# Patient Record
Sex: Female | Born: 1965 | Race: White | Hispanic: No | Marital: Married | Smoking: Never smoker
Health system: Southern US, Community
[De-identification: ages and names within clinical notes are randomized; demographics above are authoritative.]

## PROBLEM LIST (undated history)

## (undated) DIAGNOSIS — T7840XA Allergy, unspecified, initial encounter: Secondary | ICD-10-CM

## (undated) DIAGNOSIS — R519 Headache, unspecified: Secondary | ICD-10-CM

## (undated) DIAGNOSIS — D649 Anemia, unspecified: Secondary | ICD-10-CM

## (undated) DIAGNOSIS — K2970 Gastritis, unspecified, without bleeding: Secondary | ICD-10-CM

## (undated) DIAGNOSIS — K589 Irritable bowel syndrome without diarrhea: Secondary | ICD-10-CM

## (undated) HISTORY — DX: Allergy, unspecified, initial encounter: T78.40XA

## (undated) HISTORY — DX: Irritable bowel syndrome, unspecified: K58.9

## (undated) HISTORY — DX: Anemia, unspecified: D64.9

## (undated) HISTORY — DX: Gastritis, unspecified, without bleeding: K29.70

## (undated) HISTORY — DX: Headache, unspecified: R51.9

---

## 1989-04-09 HISTORY — PX: APPENDECTOMY: SHX54

## 1990-04-09 HISTORY — PX: PILONIDAL CYST EXCISION: SHX744

## 1990-04-09 HISTORY — PX: LAPAROSCOPY: SHX197

## 1993-04-09 HISTORY — PX: LAPAROSCOPY: SHX197

## 2002-04-09 HISTORY — PX: OOPHORECTOMY: SHX86

## 2005-04-09 HISTORY — PX: OVARIAN CYST REMOVAL: SHX89

## 2015-11-12 DIAGNOSIS — N3001 Acute cystitis with hematuria: Secondary | ICD-10-CM | POA: Diagnosis not present

## 2015-11-12 DIAGNOSIS — N3090 Cystitis, unspecified without hematuria: Secondary | ICD-10-CM | POA: Diagnosis not present

## 2016-07-09 DIAGNOSIS — J039 Acute tonsillitis, unspecified: Secondary | ICD-10-CM | POA: Diagnosis not present

## 2016-11-13 ENCOUNTER — Ambulatory Visit (INDEPENDENT_AMBULATORY_CARE_PROVIDER_SITE_OTHER): Payer: BLUE CROSS/BLUE SHIELD | Admitting: Family Medicine

## 2016-11-13 ENCOUNTER — Encounter: Payer: Self-pay | Admitting: Family Medicine

## 2016-11-13 VITALS — BP 134/85 | HR 75 | Ht 62.0 in | Wt 138.3 lb

## 2016-11-13 DIAGNOSIS — I8390 Asymptomatic varicose veins of unspecified lower extremity: Secondary | ICD-10-CM | POA: Insufficient documentation

## 2016-11-13 DIAGNOSIS — E28319 Asymptomatic premature menopause: Secondary | ICD-10-CM | POA: Diagnosis not present

## 2016-11-13 DIAGNOSIS — Z809 Family history of malignant neoplasm, unspecified: Secondary | ICD-10-CM | POA: Diagnosis not present

## 2016-11-13 DIAGNOSIS — Z114 Encounter for screening for human immunodeficiency virus [HIV]: Secondary | ICD-10-CM | POA: Diagnosis not present

## 2016-11-13 DIAGNOSIS — Z139 Encounter for screening, unspecified: Secondary | ICD-10-CM

## 2016-11-13 DIAGNOSIS — Z862 Personal history of diseases of the blood and blood-forming organs and certain disorders involving the immune mechanism: Secondary | ICD-10-CM | POA: Insufficient documentation

## 2016-11-13 DIAGNOSIS — M542 Cervicalgia: Secondary | ICD-10-CM | POA: Insufficient documentation

## 2016-11-13 DIAGNOSIS — Z833 Family history of diabetes mellitus: Secondary | ICD-10-CM | POA: Insufficient documentation

## 2016-11-13 DIAGNOSIS — Z1159 Encounter for screening for other viral diseases: Secondary | ICD-10-CM

## 2016-11-13 DIAGNOSIS — Z8719 Personal history of other diseases of the digestive system: Secondary | ICD-10-CM | POA: Diagnosis not present

## 2016-11-13 DIAGNOSIS — N809 Endometriosis, unspecified: Secondary | ICD-10-CM | POA: Insufficient documentation

## 2016-11-13 DIAGNOSIS — J3089 Other allergic rhinitis: Secondary | ICD-10-CM | POA: Insufficient documentation

## 2016-11-13 DIAGNOSIS — G8929 Other chronic pain: Secondary | ICD-10-CM

## 2016-11-13 DIAGNOSIS — R5383 Other fatigue: Secondary | ICD-10-CM | POA: Diagnosis not present

## 2016-11-13 DIAGNOSIS — Z9889 Other specified postprocedural states: Secondary | ICD-10-CM | POA: Insufficient documentation

## 2016-11-13 DIAGNOSIS — M549 Dorsalgia, unspecified: Secondary | ICD-10-CM

## 2016-11-13 NOTE — Patient Instructions (Addendum)
Please make an appointment in the near future for fasting blood work this will be a lab only visit.  Please don't eat past midnight.  Then make a follow-up appointment with me approximately one week later to discuss the results.      Please realize, EXERCISE IS MEDICINE!  -  American Heart Association [facility]( AHA) guidelines for exercise : If you are in good health, without any medical conditions, you should engage in 150 minutes of moderate intensity aerobic activity per week.  This means you should be huffing and puffing throughout your workout.   Engaging in regular exercise will improve brain function and memory, as well as improve mood, boost immune system and help with weight management.  As well as the other, more well-known effects of exercise such as decreasing blood sugar levels, decreasing blood pressure,  and decreasing bad cholesterol levels/ increasing good cholesterol levels.     -  The AHA strongly endorses consumption of a diet that contains a variety of foods from all the food categories with an emphasis on fruits and vegetables; fat-free and low-fat dairy products; cereal and grain products; legumes and nuts; and fish, poultry, and/or extra lean meats.    Excessive food intake, especially of foods high in saturated and trans fats, sugar, and salt, should be avoided.    Adequate water intake of roughly 1/2 of your weight in pounds, should equal the ounces of water per day you should drink.  So for instance, if you're 200 pounds, that would be 100 ounces of water per day.         Mediterranean Diet  Why follow it? Research shows. . Those who follow the Mediterranean diet have a reduced risk of heart disease  . The diet is associated with a reduced incidence of Parkinson's and Alzheimer's diseases . People following the diet may have longer life expectancies and lower rates of chronic diseases  . The Dietary Guidelines for Americans recommends the Mediterranean diet as an eating plan  to promote health and prevent disease  What Is the Mediterranean Diet?  . Healthy eating plan based on typical foods and recipes of Mediterranean-style cooking . The diet is primarily a plant based diet; these foods should make up a majority of meals   Starches - Plant based foods should make up a majority of meals - They are an important sources of vitamins, minerals, energy, antioxidants, and fiber - Choose whole grains, foods high in fiber and minimally processed items  - Typical grain sources include wheat, oats, barley, corn, brown rice, bulgar, farro, millet, polenta, couscous  - Various types of beans include chickpeas, lentils, fava beans, black beans, white beans   Fruits  Veggies - Large quantities of antioxidant rich fruits & veggies; 6 or more servings  - Vegetables can be eaten raw or lightly drizzled with oil and cooked  - Vegetables common to the traditional Mediterranean Diet include: artichokes, arugula, beets, broccoli, brussel sprouts, cabbage, carrots, celery, collard greens, cucumbers, eggplant, kale, leeks, lemons, lettuce, mushrooms, okra, onions, peas, peppers, potatoes, pumpkin, radishes, rutabaga, shallots, spinach, sweet potatoes, turnips, zucchini - Fruits common to the Mediterranean Diet include: apples, apricots, avocados, cherries, clementines, dates, figs, grapefruits, grapes, melons, nectarines, oranges, peaches, pears, pomegranates, strawberries, tangerines  Fats - Replace butter and margarine with healthy oils, such as olive oil, canola oil, and tahini  - Limit nuts to no more than a handful a day  - Nuts include walnuts, almonds, pecans, pistachios, pine nuts  -  Limit or avoid candied, honey roasted or heavily salted nuts - Olives are central to the Mediterranean diet - can be eaten whole or used in a variety of dishes   Meats Protein - Limiting red meat: no more than a few times a month - When eating red meat: choose lean cuts and keep the portion to the size  of deck of cards - Eggs: approx. 0 to 4 times a week  - Fish and lean poultry: at least 2 a week  - Healthy protein sources include, chicken, [place], lean beef, lamb - Increase intake of seafood such as tuna, salmon, trout, mackerel, shrimp, scallops - Avoid or limit high fat processed meats such as sausage and bacon  Dairy - Include moderate amounts of low fat dairy products  - Focus on healthy dairy such as fat free yogurt, skim milk, low or reduced fat cheese - Limit dairy products higher in fat such as whole or 2% milk, cheese, ice cream  Alcohol - Moderate amounts of red wine is ok  - No more than 5 oz daily for women (all ages) and men older than age 75  - No more than 10 oz of wine daily for men younger than 59  Other - Limit sweets and other desserts  - Use herbs and spices instead of salt to flavor foods  - Herbs and spices common to the traditional Mediterranean Diet include: basil, bay leaves, chives, cloves, cumin, fennel, garlic, lavender, marjoram, mint, oregano, parsley, pepper, rosemary, sage, savory, sumac, tarragon, thyme   It's not just a diet, it's a lifestyle:  . The Mediterranean diet includes lifestyle factors typical of those in the region  . Foods, drinks and meals are best eaten with others and savored . Daily physical activity is important for overall good health . This could be strenuous exercise like running and aerobics . This could also be more leisurely activities such as walking, housework, yard-work, or taking the stairs . Moderation is the key; a balanced and healthy diet accommodates most foods and drinks . Consider portion sizes and frequency of consumption of certain foods   Meal Ideas & Options:  . Breakfast:  o Whole wheat toast or whole wheat English muffins with peanut butter & hard boiled egg o Steel cut oats topped with apples & cinnamon and skim milk  o Fresh fruit: banana, strawberries, melon, berries, peaches  o Smoothies: strawberries,  bananas, greek yogurt, peanut butter o Low fat greek yogurt with blueberries and granola  o Egg white omelet with spinach and mushrooms o Breakfast couscous: whole wheat couscous, apricots, skim milk, cranberries  . Sandwiches:  o Hummus and grilled vegetables (peppers, zucchini, squash) on whole wheat bread   o Grilled chicken on whole wheat pita with lettuce, tomatoes, cucumbers or tzatziki  o Tuna salad on whole wheat bread: tuna salad made with greek yogurt, olives, red peppers, capers, green onions o Garlic rosemary lamb pita: lamb sauted with garlic, rosemary, salt & pepper; add lettuce, cucumber, greek yogurt to pita - flavor with lemon juice and black pepper  . Seafood:  o Mediterranean grilled salmon, seasoned with garlic, basil, parsley, lemon juice and black pepper o Shrimp, lemon, and spinach whole-grain pasta salad made with low fat greek yogurt  o Seared scallops with lemon orzo  o Seared tuna steaks seasoned salt, pepper, coriander topped with tomato mixture of olives, tomatoes, olive oil, minced garlic, parsley, green onions and cappers  . Meats:  o Herbed greek chicken salad  with kalamata olives, cucumber, feta  o Red bell peppers stuffed with spinach, bulgur, lean ground beef (or lentils) & topped with feta   o Kebabs: skewers of chicken, tomatoes, onions, zucchini, squash  o [place] burgers: made with red onions, mint, dill, lemon juice, feta cheese topped with roasted red peppers . Vegetarian o Cucumber salad: cucumbers, artichoke hearts, celery, red onion, feta cheese, tossed in olive oil & lemon juice  o Hummus and whole grain pita points with a greek salad (lettuce, tomato, feta, olives, cucumbers, red onion) o Lentil soup with celery, carrots made with vegetable broth, garlic, salt and pepper  o Tabouli salad: parsley, bulgur, mint, scallions, cucumbers, tomato, radishes, lemon juice, olive oil, salt and pepper.

## 2016-11-13 NOTE — Assessment & Plan Note (Signed)
Symptoms well-controlled currently.  Patient knows what activities to avoid in order to keep symptoms in check.

## 2016-11-13 NOTE — Assessment & Plan Note (Signed)
Discussed preventative strategies of Neomed sinus rinses twice a day and after any prolonged exposure to offending allergens. - Discussed over-the-counter Flonase and other medicines such as Allegra\Claritin etc. - Symptoms not bad currently.  She will let us know if she needs further direction with treatment in the future

## 2016-11-13 NOTE — Assessment & Plan Note (Signed)
Asymptomatic at current.

## 2016-11-13 NOTE — Assessment & Plan Note (Signed)
No IBS symptoms currently.    Patient avoids certain foods and knows what she can and cannot eat.  She declines need for GI referral or follow-up at this time.

## 2016-11-13 NOTE — Assessment & Plan Note (Signed)
Had seen GYN in the past.  None for approximately 6 years.   - Patient declines need for hormone replacement therapy

## 2016-11-13 NOTE — Progress Notes (Signed)
New patient office visit note:  Impression and Recommendations:    1. Environmental and seasonal allergies   2. Endometriosis   3. Early menopause occurring in patient age younger than 45 years   4. Family history of cancer- "bone CA" mother died 86   5. Family history of diabetes mellitus in mother- onset in 63s   6. Other fatigue   7. History of iron deficiency anemia   8. History of IBS   9. Encounter for health-related screening   10. Screening for HIV (human immunodeficiency virus)   11. Need for hepatitis C screening test      Chronic neck and back pain Symptoms well-controlled currently.  Patient knows what activities to avoid in order to keep symptoms in check.  Early menopause occurring in patient age younger than 45 years Had seen GYN in the past.  None for approximately 6 years.   - Patient declines need for hormone replacement therapy  Endometriosis Asymptomatic at current.  Environmental and seasonal allergies Discussed preventative strategies of Neomed sinus rinses twice a day and after any prolonged exposure to offending allergens. - Discussed over-the-counter Flonase and other medicines such as Allegra\Claritin etc. - Symptoms not bad currently.  She will let us know if she needs further direction with treatment in the future  S/P colonoscopy No IBS symptoms currently.    Patient avoids certain foods and knows what she can and cannot eat.  She declines need for GI referral or follow-up at this time.   The patient was counseled, risk factors were discussed, anticipatory guidance given.   New Prescriptions   No medications on file    Meds ordered this encounter  Medications  . cetirizine (ZYRTEC) 10 MG tablet    Sig: Take 10 mg by mouth daily.  . APPLE CIDER VINEGAR PO    Sig: Take 1 tablet by mouth daily.  . Probiotic Product (PROBIOTIC DAILY PO)    Sig: Take 1 tablet by mouth daily.    Discontinued Medications   No medications on  file    Modified Medications   No medications on file    Orders Placed This Encounter  Procedures  . CBC with Differential/Platelet  . Comprehensive metabolic panel  . Hemoglobin A1c  . Lipid panel  . HIV antibody  . Hepatitis C antibody  . TSH  . T4, free  . VITAMIN D 25 Hydroxy (Vit-D Deficiency, Fractures)     Gross side effects, risk and benefits, and alternatives of medications discussed with patient.  Patient is aware that all medications have potential side effects and we are unable to predict every side effect or drug-drug interaction that may occur.  Expresses verbal understanding and consents to current therapy plan and treatment regimen.  Return for Fasting blood work near future, then OV with me 1 wk later.  Please see AVS handed out to patient at the end of our visit for further patient instructions/ counseling done pertaining to today's office visit.    Note: This document was prepared using Dragon voice recognition software and may include unintentional dictation errors.  ----------------------------------------------------------------------------------------------------------------------    Subjective:    Chief complaint:   Chief Complaint  Patient presents with  . Establish Care     HPI: Heidi Hart is a pleasant 51 y.o. female who presents to [facility] today to review their medical history with me and establish care.   I asked the patient to review their chronic  problem list with me to ensure everything was updated and accurate.    All recent office visits with other providers, any medical records that patient brought in etc  - I reviewed today.     Also asked pt to get me [facility] from [facility] providers/ specialists that they had seen within the past 3-5 years- if they are in private practice and/or do not work for a [facility], [facility], [facility], [facility] or [facility] owned practice.  Told them to call their specialists  to clarify this if they are not sure.   [facility] - Myra Hamrick, prior with [facility], [place]  Went through past history of having heart palpitations, went to heart doc etc.  Then ended up at ENDO Doc as well- found to have mild anemia--> took iron supp and felt better.    Patient exercises at times, approximately 2-3 times weekly for 30-45 minutes and does walking videos.  She is not consistent with this   - Sedentary job.  Is a third grade teacher now.  - Patient's married to Heidi Hart they have 1  12yo adopted child Heidi Hart, from [place] who they adopted as a baby,  and recently adopted an 311 and 360 year old -Heidi Hart and Heidi Hart- whom they acquired through foster care.   This has brought her a lot of joy but also a lot of stress as well.  I have a very sensitive body."  Patient states she is very sensitive to medications.  She tends to have a lot of side effects to medicines and sensitivities.      She has a history of getting allergy shots from age [age] but felt that they started to wear off in effectiveness so patient stopped and does over-the-counter medicines for her allergies now.  She needs to take them all year long.     Problem  Environmental and Seasonal Allergies   Used to get allergy shots about age [id number]- 37 yo.  Takes zyrtec QD     Endometriosis   No GYN in town- last PAP around 6 yrs ago or so.  Always N.     Early Menopause Occurring in Patient Age Younger Than 45 Years  S/P Colonoscopy   04/16/05--> done for IBS sx.  Found nothing.    Chronic Neck and Back Pain   Pain can even go down her leg on R.     Varicose vein of leg -R  Family history of cancer- "bone CA" mother died [id number]    "bone CA" mother died [id number]; father non-Hodgkin's lymphoma    Family history of diabetes mellitus in mother- onset in 5140s   Patient's mother with similar body habitus as herself.  Onset of type 2 diabetes in her mid 5040s.   Fatigue   Patient feeling more tired than usual for the  past year or so;  changed her job to a very sedentary 1 at that time; she's put on approximately 10-15 pounds;  job is more stressful now not sure if that is playing part of it or if she has a endocrine problem etc.   History of Iron Deficiency Anemia   Patient had seen endocrinology in the remote past when she was having some heart palpitations and nonspecific cardiac symptoms.  Cardiology workup was negative and she was sent to endocrine.    Found to be very mildly iron deficient anemia and supplementation made her feel much better.  Symptoms were resolved then.   History of Ibs  Wt Readings from Last 3 Encounters:  11/29/2016 138 lb 4.8 oz (62.7 kg)   BP Readings from Last 3 Encounters:  Nov 29, 2016 134/85   Pulse Readings from Last 3 Encounters:  2016-11-29 75   BMI Readings from Last 3 Encounters:  11/29/2016 25.30 kg/m    Patient Care Team    Relationship Specialty Notifications Start End  Thomasene Lot, DO PCP - General Family Medicine  10/31/16     Patient Active Problem List   Diagnosis Date Noted  . Environmental and seasonal allergies 2016/11/29  . Endometriosis 11-29-2016  . Early menopause occurring in patient age younger than 39 years 11/29/16  . S/P colonoscopy 2016/11/29  . Chronic neck and back pain 11-29-16  . Varicose vein of leg -R 11/29/16  . Family history of cancer- "bone CA" mother died 69 November 29, 2016  . Family history of diabetes mellitus in mother- onset in 9s 2016-11-29  . Fatigue 11/29/16  . History of iron deficiency anemia November 29, 2016  . History of IBS 2016/11/29     Past Medical History:  Diagnosis Date  . Allergy      Past Medical History:  Diagnosis Date  . Allergy      Past Surgical History:  Procedure Laterality Date  . APPENDECTOMY  1991  . LAPAROSCOPY  1992  . LAPAROSCOPY  1995  . OOPHORECTOMY  2004   left  . OVARIAN CYST REMOVAL  2007  . PILONIDAL CYST EXCISION  1992     Family History  Problem Relation  Age of Onset  . Cancer Mother        bone  . Hypertension Mother   . Cancer Father        nonhodgkins lymphoma     History  Drug Use No     History  Alcohol Use No     History  Smoking Status  . Never Smoker  Smokeless Tobacco  . Never Used     Outpatient Encounter Prescriptions as of 11-29-16  Medication Sig  . APPLE CIDER VINEGAR PO Take 1 tablet by mouth daily.  . cetirizine (ZYRTEC) 10 MG tablet Take 10 mg by mouth daily.  . Probiotic Product (PROBIOTIC DAILY PO) Take 1 tablet by mouth daily.   No facility-administered encounter medications on file as of November 29, 2016.     Allergies: Cogentin [benztropine]; Reglan [metoclopramide]; and Sulfa antibiotics   Review of Systems  Constitutional: Negative for chills, diaphoresis, fever, malaise/fatigue and weight loss.  HENT: Negative for congestion, sore throat and tinnitus.   Eyes: Negative for blurred vision, double vision and photophobia.  Respiratory: Negative for cough and wheezing.   Cardiovascular: Negative for chest pain and palpitations.  Gastrointestinal: Negative for blood in stool, diarrhea, nausea and vomiting.  Genitourinary: Negative for dysuria, frequency and urgency.  Musculoskeletal: Negative for joint pain and myalgias.  Skin: Negative for itching and rash.  Neurological: Negative for dizziness, focal weakness, weakness and headaches.  Endo/Heme/Allergies: Negative for environmental allergies and polydipsia. Does not bruise/bleed easily.  Psychiatric/Behavioral: Negative for depression and memory loss. The patient is not nervous/anxious and does not have insomnia.      Objective:   Blood pressure 134/85, pulse 75, height 5\' 2"  (1.575 m), weight 138 lb 4.8 oz (62.7 kg), last menstrual period 10/24/2016. Body mass index is 25.3 kg/m. General: Well Developed, well nourished, and in no acute distress.  Neuro: Alert and oriented x3, extra-ocular muscles intact, sensation grossly intact.    HEENT:[place]/AT, PERRLA, neck supple, No carotid bruits Skin: no gross  rashes  Cardiac: Regular rate and rhythm Respiratory: Essentially clear to auscultation bilaterally. Not using accessory muscles, speaking in full sentences.  Abdominal: not grossly distended Musculoskeletal: Ambulates w/o diff, FROM * 4 ext.  Vasc: less 2 sec cap RF, warm and pink  Psych:  No HI/SI, judgement and insight good, Euthymic mood. Full Affect.    No results found for this or any previous visit (from the past 2160 hour(s)).

## 2016-11-14 ENCOUNTER — Other Ambulatory Visit: Payer: BLUE CROSS/BLUE SHIELD

## 2016-11-14 DIAGNOSIS — R5383 Other fatigue: Secondary | ICD-10-CM | POA: Diagnosis not present

## 2016-11-14 DIAGNOSIS — Z833 Family history of diabetes mellitus: Secondary | ICD-10-CM

## 2016-11-14 DIAGNOSIS — Z114 Encounter for screening for human immunodeficiency virus [HIV]: Secondary | ICD-10-CM

## 2016-11-14 DIAGNOSIS — Z139 Encounter for screening, unspecified: Secondary | ICD-10-CM

## 2016-11-14 DIAGNOSIS — Z862 Personal history of diseases of the blood and blood-forming organs and certain disorders involving the immune mechanism: Secondary | ICD-10-CM | POA: Diagnosis not present

## 2016-11-14 DIAGNOSIS — Z1159 Encounter for screening for other viral diseases: Secondary | ICD-10-CM

## 2016-11-14 DIAGNOSIS — Z8719 Personal history of other diseases of the digestive system: Secondary | ICD-10-CM

## 2016-11-15 LAB — CBC WITH DIFFERENTIAL/PLATELET
BASOS: 0 %
Basophils Absolute: 0 10*3/uL (ref 0.0–0.2)
EOS (ABSOLUTE): 0.2 10*3/uL (ref 0.0–0.4)
EOS: 3 %
HEMATOCRIT: 34.9 % (ref 34.0–46.6)
Hemoglobin: 11.8 g/dL (ref 11.1–15.9)
IMMATURE GRANS (ABS): 0 10*3/uL (ref 0.0–0.1)
IMMATURE GRANULOCYTES: 0 %
LYMPHS: 28 %
Lymphocytes Absolute: 1.4 10*3/uL (ref 0.7–3.1)
MCH: 29.4 pg (ref 26.6–33.0)
MCHC: 33.8 g/dL (ref 31.5–35.7)
MCV: 87 fL (ref 79–97)
MONOCYTES: 7 %
Monocytes Absolute: 0.4 10*3/uL (ref 0.1–0.9)
NEUTROS ABS: 3 10*3/uL (ref 1.4–7.0)
Neutrophils: 62 %
Platelets: 202 10*3/uL (ref 150–379)
RBC: 4.01 x10E6/uL (ref 3.77–5.28)
RDW: 12.8 % (ref 12.3–15.4)
WBC: 4.9 10*3/uL (ref 3.4–10.8)

## 2016-11-15 LAB — LIPID PANEL
CHOL/HDL RATIO: 3.1 ratio (ref 0.0–4.4)
Cholesterol, Total: 186 mg/dL (ref 100–199)
HDL: 60 mg/dL (ref >39)
LDL CALC: 118 mg/dL — AB (ref 0–99)
Triglycerides: 41 mg/dL (ref 0–149)
VLDL CHOLESTEROL CAL: 8 mg/dL (ref 5–40)

## 2016-11-15 LAB — HIV ANTIBODY (ROUTINE TESTING W REFLEX): HIV SCREEN 4TH GENERATION: NONREACTIVE

## 2016-11-15 LAB — COMPREHENSIVE METABOLIC PANEL
A/G RATIO: 1.8 (ref 1.2–2.2)
ALT: 19 IU/L (ref 0–32)
AST: 20 IU/L (ref 0–40)
Albumin: 4.3 g/dL (ref 3.5–5.5)
Alkaline Phosphatase: 70 IU/L (ref 39–117)
BUN/Creatinine Ratio: 30 — ABNORMAL HIGH (ref 9–23)
BUN: 21 mg/dL (ref 6–24)
Bilirubin Total: 0.4 mg/dL (ref 0.0–1.2)
CALCIUM: 9.4 mg/dL (ref 8.7–10.2)
CO2: 25 mmol/L (ref 20–29)
Chloride: 104 mmol/L (ref 96–106)
Creatinine, Ser: 0.71 mg/dL (ref 0.57–1.00)
GFR, EST AFRICAN AMERICAN: 116 mL/min/{1.73_m2} (ref >59)
GFR, EST NON AFRICAN AMERICAN: 101 mL/min/{1.73_m2} (ref >59)
GLOBULIN, TOTAL: 2.4 g/dL (ref 1.5–4.5)
Glucose: 87 mg/dL (ref 65–99)
POTASSIUM: 4.3 mmol/L (ref 3.5–5.2)
SODIUM: 142 mmol/L (ref 134–144)
TOTAL PROTEIN: 6.7 g/dL (ref 6.0–8.5)

## 2016-11-15 LAB — T4, FREE: Free T4: 1.14 ng/dL (ref 0.82–1.77)

## 2016-11-15 LAB — TSH: TSH: 2.01 u[IU]/mL (ref 0.450–4.500)

## 2016-11-15 LAB — HEMOGLOBIN A1C
Est. average glucose Bld gHb Est-mCnc: 108 mg/dL
Hgb A1c MFr Bld: 5.4 % (ref 4.8–5.6)

## 2016-11-15 LAB — VITAMIN D 25 HYDROXY (VIT D DEFICIENCY, FRACTURES): Vit D, 25-Hydroxy: 39.9 ng/mL (ref 30.0–100.0)

## 2016-11-15 LAB — HEPATITIS C ANTIBODY

## 2016-11-20 ENCOUNTER — Ambulatory Visit (INDEPENDENT_AMBULATORY_CARE_PROVIDER_SITE_OTHER): Payer: BLUE CROSS/BLUE SHIELD | Admitting: Family Medicine

## 2016-11-20 ENCOUNTER — Encounter: Payer: Self-pay | Admitting: Family Medicine

## 2016-11-20 NOTE — Progress Notes (Signed)
Impression and Recommendations:    No diagnosis found.   No problem-specific Assessment & Plan notes found for this encounter.   The patient was counseled, risk factors were discussed, anticipatory guidance given.   New Prescriptions   No medications on file     Discontinued Medications   No medications on file     Modified Medications   No medications on file      No orders of the defined types were placed in this encounter.    Gross side effects, risk and benefits, and alternatives of medications and treatment plan in general discussed with patient.  Patient is aware that all medications have potential side effects and we are unable to predict every side effect or drug-drug interaction that may occur.   Patient will call with any questions prior to using medication if they have concerns.  Expresses verbal understanding and consents to current therapy and treatment regimen.  No barriers to understanding were identified.  Red flag symptoms and signs discussed in detail.  Patient expressed understanding regarding what to do in case of emergency\urgent symptoms  Please see AVS handed out to patient at the end of our visit for further patient instructions/ counseling done pertaining to today's office visit.   No Follow-up on file.     Note: This document was prepared using Dragon voice recognition software and may include unintentional dictation errors.  Heidi Hart 11:50 AM --------------------------------------------------------------------------------------------------------------------------------------------------------------------------------------------------------------------------------------------    Subjective:    CC:  Chief Complaint  Patient presents with  . Follow-up    discuss labs    HPI: Heidi Hart is a 51 y.o. female [facility] for issues as discussed below.   No problems  updated.   Wt Readings from Last 3 Encounters:  11/20/16 139 lb 9.6 oz (63.3 kg)  11/13/16 138 lb 4.8 oz (62.7 kg)   BP Readings from Last 3 Encounters:  11/20/16 125/84  11/13/16 134/85   Pulse Readings from Last 3 Encounters:  11/20/16 68  11/13/16 75   BMI Readings from Last 3 Encounters:  11/20/16 25.53 kg/m  11/13/16 25.30 kg/m     Patient Care Team    Relationship Specialty Notifications Start End  Heidi Hart, Heidi Upperman, DO PCP - General Family Medicine  10/31/16      Patient Active Problem List   Diagnosis Date Noted  . Environmental and seasonal allergies 11/13/2016  . Endometriosis 11/13/2016  . Early menopause occurring in patient age younger than 5245 years 11/13/2016  . S/P colonoscopy 11/13/2016  . Chronic neck and back pain 11/13/2016  . Varicose vein of leg -R 11/13/2016  . Family history of cancer- "bone CA" mother died 5256 11/13/2016  . Family history of diabetes mellitus in mother- onset in 8040s 11/13/2016  . Fatigue 11/13/2016  . History of iron deficiency anemia 11/13/2016  . History of IBS 11/13/2016    Past Medical history, Surgical history, Family history, Social history, Allergies and Medications have been entered into the medical record, reviewed and changed as needed.    Current Meds  Medication Sig  . APPLE CIDER VINEGAR PO Take 1 tablet by mouth daily.  . cetirizine (ZYRTEC) 10 MG tablet Take 10 mg by mouth daily.  . Probiotic Product (PROBIOTIC DAILY PO) Take 1 tablet by mouth daily.    Allergies:  Allergies  Allergen Reactions  . Cogentin [Benztropine]     hallucations  . Reglan [Metoclopramide]  Muscle spasms  . Sulfa Antibiotics     tingling     Review of Systems: General:   Denies fever, chills, unexplained weight loss.  Optho/Auditory:   Denies visual changes, blurred vision/LOV Respiratory:   Denies wheeze, DOE more than baseline levels.  Cardiovascular:   Denies chest pain, palpitations, new onset peripheral edema    Gastrointestinal:   Denies nausea, vomiting, diarrhea, abd pain.  Genitourinary: Denies dysuria, freq/ urgency, flank pain or discharge from genitals.  Endocrine:     Denies hot or cold intolerance, polyuria, polydipsia. Musculoskeletal:   Denies unexplained myalgias, joint swelling, unexplained arthralgias, gait problems.  Skin:  Denies new onset rash, suspicious lesions Neurological:     Denies dizziness, unexplained weakness, numbness  Psychiatric/Behavioral:   Denies mood changes, suicidal or homicidal ideations, hallucinations    Objective:   Blood pressure 125/84, pulse 68, height 5\' 2"  (1.575 m), weight 139 lb 9.6 oz (63.3 kg), last menstrual period 10/24/2016. Body mass index is 25.53 kg/m. General:  Well Developed, well nourished, appropriate for stated age.  Neuro:  Alert and oriented,  extra-ocular muscles intact  HEENT:  Normocephalic, atraumatic, neck supple, no carotid bruits appreciated  Skin:  no gross rash, warm, pink. Cardiac:  RRR, S1 S2 Respiratory:  ECTA B/L and A/P, Not using accessory muscles, speaking in full sentences- unlabored. Vascular:  Ext warm, no cyanosis apprec.; cap RF less 2 sec. Psych:  No HI/SI, judgement and insight good, Euthymic mood. Full Affect.

## 2016-11-20 NOTE — Patient Instructions (Addendum)
"Lloyd Huger med sinus rinse" or AYR or "sinus rinses" - comes in plastic bottle.      Preventive Care for Adults  A healthy lifestyle and preventive care can promote health and wellness. Preventive health guidelines for men include the following key practices:  .   A routine yearly physical is a good way to check with your health care provider about your health and preventative screening. It is a chance to share any concerns and updates on your health and to receive a thorough exam.  .  Visit your dentist for a routine exam and preventative care every 6 months. Brush your teeth twice a day and floss once a day. Good oral hygiene prevents tooth decay and gum disease.  .  The frequency of eye exams is based on your age, health, family medical history, use of contact lenses, and other factors.  Follow your health care provider's recommendations for frequency of eye exams.  .  Eat a healthy diet.  Foods such as vegetables, fruits, whole grains, low-fat dairy products, and lean protein foods contain the nutrients you need without too many calories.  Decrease your intake of foods high in solid fats, added sugars, and salt.  Eat the right amount of calories for you.  Get information about a proper diet from your health care provider, if necessary.  .  Regular physical exercise is one of the most important things you can do for your health.  Most adults should get at least 150 minutes of moderate-intensity exercise (any activity that increases your heart rate and causes you to sweat) each week.  In addition, most adults need muscle-strengthening exercises on 2 or more days a week.  .  Maintain a healthy weight. The body mass index (BMI) is a screening tool to identify possible weight problems. It provides an estimate of body fat based on height and weight. Your health care provider can find your BMI and can help you achieve or maintain a healthy weight. For adults 20 years and older: A BMI below 18.5 is  considered underweight. A BMI of 18.5 to 24.9 is normal. A BMI of 25 to 29.9 is considered overweight. A BMI of 30 and above is considered obese.  .  Maintain normal blood lipids and cholesterol levels by exercising and minimizing your intake of saturated fat. Eat a balanced diet with plenty of fruit and vegetables. Blood tests for lipids and cholesterol should begin at age 65 and be repeated every 5 years. If your lipid or cholesterol levels are high, you are over 50, or you are at high risk for heart disease, you may need your cholesterol levels checked more frequently. Ongoing high lipid and cholesterol levels should be treated with medicines if diet and exercise are not working.  .  If you smoke, find out from your health care provider how to quit. If you do not use tobacco, do not start.  . If you choose to drink alcohol, do not have more than 2 drinks per day. One drink is considered to be 12 ounces (355 mL) of beer, 5 ounces (148 mL) of wine, or 1.5 ounces (44 mL) of liquor.  Marland Kitchen Avoid use of street drugs. Do not share needles with anyone. Ask for help if you need support or instructions about stopping the use of drugs.  . High blood pressure causes heart disease and increases the risk of stroke. Your blood pressure should be checked at least every 1-2 years. Ongoing high blood pressure should be  treated with medicines, if weight loss and exercise are not effective.  . If you are 7-39 years old, ask your health care provider if you should take aspirin to prevent heart disease.  . Diabetes screening involves taking a blood sample to check your fasting blood sugar level.  This should be done once every 3 years, after age 67, if you are within normal weight and without risk factors for diabetes.  Testing should be considered at a younger age or be carried out more frequently if you are overweight and have at least 1 risk factor for diabetes.  . Colorectal cancer can be detected and  often prevented. Most routine colorectal cancer screening begins at the age of 75 and continues through age 49. However, your health care provider may recommend screening at an earlier age if you have risk factors for colon cancer. On a yearly basis, your health care provider may provide home test kits to check for hidden blood in the stool. Use of a small camera at the end of a tube to directly examine the colon (sigmoidoscopy or colonoscopy) can detect the earliest forms of colorectal cancer. Talk to your health care provider about this at age 6, when routine screening begins. Direct exam of the colon should be repeated every 5-10 years through age 40, unless early forms of precancerous polyps or small growths are found.  .  Lung cancer screening is recommended for adults aged 55-80 years who are at high risk for developing lung cancer because of a history of smoking. A yearly low-dose CT scan of the lungs is recommended for people who have at least a 30-pack-year history of smoking and are a current smoker or have quit within the past 15 years. A pack year of smoking is smoking an average of 1 pack of cigarettes a day for 1 year (for example: 1 pack a day for 30 years or 2 packs a day for 15 years). Yearly screening should continue until the smoker has stopped smoking for at least 15 years. Yearly screening should be stopped for people who develop a health problem that would prevent them from having lung cancer treatment.  . Talk with your health care provider about prostate cancer screening.  . Testicular cancer screening is recommended for adult males. Screening includes self-exam and a health care provider exam. Consult with your health care provider about any symptoms you have or any concerns you have about testicular cancer.  . Use sunscreen. Apply sunscreen liberally and repeatedly throughout the day. You should seek shade when your shadow is shorter than you. Protect yourself by wearing long  sleeves, pants, a wide-brimmed hat, and sunglasses year round, whenever you are outdoors.  . Once a month, do a whole-body skin exam, using a mirror to look at the skin on your back. Tell your health care provider about new moles, moles that have irregular borders, moles that are larger than a pencil eraser, or moles that have changed in shape or color.    ++++++++++++++++++++++++++++++++++++++++++++++++++++++++++++++++++  Stay current with required vaccines (immunizations).  ? Influenza vaccine. All adults should be immunized every year.  ? Tetanus, diphtheria, and acellular pertussis (Td, Tdap) vaccine. An adult who has not previously received Tdap or who does not know his vaccine status should receive 1 dose of Tdap. This initial dose should be followed by tetanus and diphtheria toxoids (Td) booster doses every 10 years. Adults with an unknown or incomplete history of completing a 3-dose immunization series with Td-containing vaccines  should begin or complete a primary immunization series including a Tdap dose. Adults should receive a Td booster every 10 years.  ? Varicella vaccine. An adult without evidence of immunity to varicella should receive 2 doses or a second dose if he has previously received 1 dose.  ? Human papillomavirus (HPV) vaccine. Males aged 13-21 years who have not received the vaccine previously should receive the 3-dose series. Males aged 22-26 years may be immunized. Immunization is recommended through the age of 110 years for any female who has sex with males and did not get any or all doses earlier. Immunization is recommended for any person with an immunocompromised condition through the age of 26 years if he did not get any or all doses earlier. During the 3-dose series, the second dose should be obtained 4-8 weeks after the first dose. The third dose should be obtained 24 weeks after the first dose and 16 weeks after the second dose.  ? Zoster vaccine. One dose is  recommended for adults aged 1 years or older unless certain conditions are present.   ? PREVNAR - Pneumococcal 13-valent conjugate (PCV13) vaccine. When indicated, a person who is uncertain of his immunization history and has no record of immunization should receive the PCV13 vaccine. An adult aged 5 years or older who has certain medical conditions and has not been previously immunized should receive 1 dose of PCV13 vaccine. This PCV13 should be followed with a dose of pneumococcal polysaccharide (PPSV23) vaccine. The PPSV23 vaccine dose should be obtained at least 8 weeks after the dose of PCV13 vaccine. An adult aged 110 years or older who has certain medical conditions and previously received 1 or more doses of PPSV23 vaccine should receive 1 dose of PCV13. The PCV13 vaccine dose should be obtained 1 or more years after the last PPSV23 vaccine dose.   ? PNEUMOVAX - Pneumococcal polysaccharide (PPSV23) vaccine. When PCV13 is also indicated, PCV13 should be obtained first. All adults aged 15 years and older should be immunized. An adult younger than age 74 years who has certain medical conditions should be immunized. Any person who resides in a nursing home or long-term care facility should be immunized. An adult smoker should be immunized. People with an immunocompromised condition and certain other conditions should receive both PCV13 and PPSV23 vaccines. People with human immunodeficiency virus (HIV) infection should be immunized as soon as possible after diagnosis. Immunization during chemotherapy or radiation therapy should be avoided. Routine use of PPSV23 vaccine is not recommended for American Indians, [address], or people younger than 65 years unless there are medical conditions that require PPSV23 vaccine. When indicated, people who have unknown immunization and have no record of immunization should receive PPSV23 vaccine. One-time revaccination 5 years after the first dose of PPSV23 is  recommended for people aged 19-64 years who have chronic kidney failure, nephrotic syndrome, asplenia, or immunocompromised conditions. People who received 1-2 doses of PPSV23 before age 10 years should receive another dose of PPSV23 vaccine at age 78 years or later if at least 5 years have passed since the previous dose. Doses of PPSV23 are not needed for people immunized with PPSV23 at or after age 48 years.   ? Hepatitis A vaccine. Adults who wish to be protected from this disease, have certain high-risk conditions, work with hepatitis A-infected animals, work in hepatitis A research labs, or travel to or work in countries with a high rate of hepatitis A should be immunized. Adults who were previously  unvaccinated and who anticipate close contact with an international adoptee during the first 60 days after arrival in the [place] from a country with a high rate of hepatitis A should be immunized.  ? Hepatitis B vaccine. Adults should be immunized if they wish to be protected from this disease, have certain high-risk conditions, may be exposed to blood or other infectious body fluids, are household contacts or sex partners of hepatitis B positive people, are clients or workers in certain care facilities, or travel to or work in countries with a high rate of hepatitis B.     Preventive Service / Frequency  . Ages 7940 to 8864  Blood pressure check.  Lipid and cholesterol check  Lung cancer screening. / Every year if you are aged 55-80 years and have a 30-pack-year history of smoking and currently smoke or have quit within the past 15 years. Yearly screening is stopped once you have quit smoking for at least 15 years or develop a health problem that would prevent you from having lung cancer treatment.  Fecal occult blood test (FOBT) of stool. / Every year beginning at age 49 and continuing until age 49. You may not have to do this test if you get a colonoscopy every 10 years.  Flexible  sigmoidoscopy** or colonoscopy.** / Every 5 years for a flexible sigmoidoscopy or every 10 years for a colonoscopy beginning at age 49 and continuing until age 49. Screening for abdominal aortic aneurysm (AAA) by ultrasound is recommended for people who have history of high blood pressure or who are current or former smokers.  ++++++++++++++++++++++++++++++++++++++++++++++++++++++++++++++  Recommend Adult Low Dose Aspirin or coated Aspirin 81 mg daily To reduce risk of Colon Cancer 20 % Skin Cancer 26 %  Melanoma 46% and Pancreatic cancer 60%  +++++++++++++++++++++++++++++++++++++++++++++++++++++++++++++  Vitamin D goal is between 50-100. Please make sure that you are taking your Vitamin D as directed.  It is very important as a natural anti-inflammatory - helping with muscle and joint aches; as well as helping hair, skin, and nails; as well as reducing stroke, heart attack and cancer risk. It helps your bones and helps with mood. It also decreases numerous cancer risks so please take it as directed.  - Low Vit D is associated with a 200-300% higher risk for CANCER and 200-300% higher risk for HEART ATTACK & STROKE.  It is also associated with higher death rate at younger ages, autoimmune diseases like Rheumatoid arthritis, Lupus, Multiple Sclerosis; also many other serious conditions, like depression, Alzheimer's Dementia, infertility, muscle aches, fatigue, fibromyalgia - just to name a few.  +++++++++++++++++++++++++++++++++++++++++++++++++++++++++++  Recommend the book "The END of DIETING" by Dr Monico HoarJoel Fuhrman & the book "The END of DIABETES " by Dr Monico HoarJoel Fuhrman At [email or web address] - get book & Audio CD's   --->Being diabetic has a 300% increased risk for heart attack, stroke, cancer, and alzheimer- type vascular dementia. It is very important that you work harder with diet by avoiding all foods that are white. Avoid white rice (brown & wild rice is OK), white potatoes (sweet potatoes in  moderation is OK), White bread or wheat bread or anything made out of white flour like bagels, donuts, rolls, buns, biscuits, cakes, pastries, cookies, pizza crust, and pasta (made from white flour & egg whites) - vegetarian pasta or spinach or wheat pasta is OK. Multigrain breads like Arnold's or [employer], or multigrain sandwich thins or flatbreads. Diet, exercise and weight loss can reverse and cure diabetes in the early  stages. Diet, exercise and weight loss is very important in the control and prevention of complications of diabetes which affects every system in your body, ie. Brain - dementia/stroke, eyes - glaucoma/blindness, heart - heart attack/heart failure, kidneys - dialysis, stomach - gastric paralysis, intestines - malabsorption, nerves - severe painful neuritis, circulation - gangrene & loss of a leg(s), and finally cancer and Alzheimers.  I recommend avoid fried & greasy foods, sweets/candy, white rice (brown or wild rice or Quinoa is OK), white potatoes (sweet potatoes are OK) - anything made from white flour - bagels, doughnuts, rolls, buns, biscuits,white and wheat breads, pizza crust and traditional pasta made of white flour & egg white(vegetarian pasta or spinach or wheat pasta is OK). Multi-grain bread is OK - like multi-grain flat bread or sandwich thins. Avoid alcohol in excess.  Exercise is also important. Eat all the vegetables you want - avoid fatty meats, especially red meat and dairy - especially cheese. Cheese is the most concentrated form of trans-fats which is the worst thing to clog up our arteries. Veggie cheese is OK which can be found in the fresh produce section at [employer] or [employer] or [employer].  ++++++++++++++++++++++ DASH Eating Plan  DASH stands for "Dietary Approaches to Stop Hypertension."  The DASH eating plan is a healthy eating plan that has been shown to reduce high blood pressure (hypertension). Additional health benefits may include  reducing the risk of type 2 diabetes mellitus, heart disease, and stroke. The DASH eating plan may also help with weight loss.  WHAT DO I NEED TO KNOW ABOUT THE DASH EATING PLAN? For the DASH eating plan, you will follow these general guidelines: . Choose foods with a percent daily value for sodium of less than 5% (as listed on the food label). . Use salt-free seasonings or herbs instead of table salt or sea salt. . Check with your health care provider or pharmacist before using salt substitutes. . Eat lower-sodium products, often labeled as "lower sodium" or "no salt added." . Eat fresh foods. . Eat more vegetables, fruits, and low-fat dairy products. . Choose whole grains. Look for the word "whole" as the first word in the ingredient list. . Choose fish . Limit sweets, desserts, sugars, and sugary drinks. . Choose heart-healthy fats. . Eat veggie cheese . Eat more home-cooked food and less restaurant, buffet, and fast food. . Limit fried foods. Adriana Simas foods using methods other than frying. . Limit canned vegetables. If you do use them, rinse them well to decrease the sodium. . When eating at a restaurant, ask that your food be prepared with less salt, or no salt if possible.   WHAT FOODS CAN I EAT? Read Dr Francis Dowse Fuhrman's books on The End of Dieting & The End of Diabetes  Grains Whole grain or whole wheat bread. Brown rice. Whole grain or whole wheat pasta. Quinoa, bulgur, and whole grain cereals. Low-sodium cereals. Corn or whole wheat flour tortillas. Whole grain cornbread. Whole grain crackers. Low-sodium crackers.  Vegetables Fresh or frozen vegetables (raw, steamed, roasted, or grilled). Low-sodium or reduced-sodium tomato and vegetable juices. Low-sodium or reduced-sodium tomato sauce and paste. Low-sodium or reduced-sodium canned vegetables.  Fruits All fresh, canned (in natural juice), or frozen fruits.  Protein Products All fish and seafood. Dried beans,  peas, or lentils. Unsalted nuts and seeds. Unsalted canned beans.  Dairy Low-fat dairy products, such as skim or 1% milk, 2% or reduced-fat cheeses, low-fat ricotta or cottage cheese, or plain low-fat yogurt.  Low-sodium or reduced-sodium cheeses.  Fats and Oils Tub margarines without trans fats. Light or reduced-fat mayonnaise and salad dressings (reduced sodium). Avocado. Safflower, olive, or canola oils. Natural peanut or almond butter.  Other Unsalted popcorn and pretzels. The items listed above may not be a complete list of recommended foods or beverages. Contact your dietitian for more options.  ++++++++++++++++++++++++++++++++++++++++++++++++++++++++++++++++  WHAT FOODS ARE NOT RECOMMENDED?  Grains/ White flour or wheat flour White bread. White pasta. White rice. Refined cornbread. Bagels and croissants. Crackers that contain trans fat. Vegetables Creamed or fried vegetables. Vegetables in a . Regular canned vegetables. Regular canned tomato sauce and paste. Regular tomato and vegetable juices. Fruits Dried fruits. Canned fruit in light or heavy syrup. Fruit juice. Meat and Other Protein Products Meat in general - RED meat & White meat. Fatty cuts of meat. Ribs, chicken wings, all processed meats as bacon, sausage, bologna, salami, fatback, hot dogs, bratwurst and packaged luncheon meats. Dairy Whole or 2% milk, cream, half-and-half, and cream cheese. Whole-fat or sweetened yogurt. Full-fat cheeses or blue cheese. Non-dairy creamers and whipped toppings. Processed cheese, cheese spreads, or cheese curds.  Condiments Onion and garlic salt, seasoned salt, table salt, and sea salt. Canned and packaged gravies. Worcestershire sauce. Tartar sauce. Barbecue sauce. Teriyaki sauce. Soy sauce, including reduced sodium. Steak sauce. Fish sauce. Oyster sauce. Cocktail sauce. Horseradish. Ketchup and mustard. Meat flavorings and tenderizers. Bouillon cubes. Hot sauce. Tabasco sauce. Marinades.  Taco seasonings. Relishes. Fats and Oils Butter, stick margarine, lard, shortening and bacon fat. Coconut, palm kernel, or palm oils. Regular salad dressings. Pickles and olives. Salted popcorn and pretzels. The items listed above may not be a complete list of foods and beverages to avoid.

## 2017-02-21 ENCOUNTER — Encounter: Payer: Self-pay | Admitting: Family Medicine

## 2017-02-21 ENCOUNTER — Ambulatory Visit (INDEPENDENT_AMBULATORY_CARE_PROVIDER_SITE_OTHER): Payer: BLUE CROSS/BLUE SHIELD | Admitting: Family Medicine

## 2017-02-21 VITALS — BP 118/80 | HR 74 | Ht 62.0 in | Wt 142.0 lb

## 2017-02-21 DIAGNOSIS — K209 Esophagitis, unspecified without bleeding: Secondary | ICD-10-CM

## 2017-02-21 MED ORDER — OMEPRAZOLE 40 MG PO CPDR: 40.0000 mg | DELAYED_RELEASE_CAPSULE | Freq: Two times a day (BID) | ORAL | 0 refills | Status: DC

## 2017-02-21 MED ORDER — RANITIDINE HCL 300 MG PO TABS: 300.0000 mg | ORAL_TABLET | Freq: Two times a day (BID) | ORAL | 0 refills | Status: DC

## 2017-02-21 NOTE — Progress Notes (Signed)
Pt here for an acute care OV today   Impression and Recommendations:    1. Esophagitis    - supportive care discussed with patient, handouts provided.  Discussion of medications had.  If no improvement after 1 month we will send to GI.  Meds ordered this encounter           . ranitidine (ZANTAC) 300 MG tablet    Sig: Take 1 tablet (300 mg total) 2 (two) times daily by mouth.    Dispense:  60 tablet    Refill:  0  . omeprazole (PRILOSEC) 40 MG capsule    Sig: Take 1 capsule (40 mg total) 2 (two) times daily by mouth.    Dispense:  60 capsule    Refill:  0    No refills until seen by physician   Gross side effects, risk and benefits, and alternatives of medications and treatment plan in general discussed with patient.  Patient is aware that all medications have potential side effects and we are unable to predict every side effect or drug-drug interaction that may occur.   Patient will call with any questions prior to using medication if they have concerns.  Expresses verbal understanding and consents to current therapy and treatment regimen.  No barriers to understanding were identified.  Red flag symptoms and signs discussed in detail.  Patient expressed understanding regarding what to do in case of emergency\urgent symptoms  Please see AVS handed out to patient at the end of our visit for further patient instructions/ counseling done pertaining to today's office visit.   Return if symptoms worsen or fail to improve, for -Also follow-up for chronic care as previously discussed; q 57mo.     Note: This document was prepared using Dragon voice recognition software and may include unintentional dictation errors.  Linnette Panella 10:03  AM --------------------------------------------------------------------------------------------------------------------------------------------------------------------------------------------------------------------------------------------    Subjective:    CC:  Chief Complaint  Patient presents with  . Swallowing Issues    Feels like food is getting hung up or moving slowly     HPI: Heidi Hart is a 51 y.o. female who [facility] for issues as discussed below.  About 3 wks ago- took some pills and felt like something got stuck in throat Apple acid vinegar tablets- w/in 8 hrs or so terrible pain in back. From that point on, feels like something is hanging or some pain in her back only with food consumption.  She is taking over-the-counter Prevacid which help with the epigastric pain and feeling like something was stuck in her throat but since then she is having a sensation like something is there still in her mid back.  Symptoms are slowly improving since 3 weeks ago.  Sometimes lying down can make it worse.  And if she eats large bites of food at one time.  What makes it better is: Staying erect and eating smaller bites. 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     Wt Readings from Last 3 Encounters:  02/21/17 142 lb (64.4 kg)  11/20/16 139 lb 9.6 oz (63.3 kg)  11/13/16 138 lb 4.8 oz (62.7 kg)   BP Readings from Last 3 Encounters:  02/21/17 118/80  11/20/16 125/84  11/13/16 134/85   BMI Readings from Last 3 Encounters:  02/21/17 25.97 kg/m  11/20/16 25.53 kg/m  11/13/16 25.30 kg/m     Patient Care Team  Relationship Specialty Notifications Start End  Thomasene Lotpalski, Elchanan Bob, DO PCP - General Family Medicine  10/31/16   Huel Coteichardson, Kathy, MD Consulting Physician Obstetrics and Gynecology  11/20/16      Patient Active Problem List   Diagnosis Date Noted  . Environmental and seasonal allergies  11/13/2016  . Endometriosis 11/13/2016  . Early menopause occurring in patient age younger than 4845 years 11/13/2016  . S/P colonoscopy 11/13/2016  . Chronic neck and back pain 11/13/2016  . Varicose vein of leg -R 11/13/2016  . Family history of cancer- "bone CA" mother died [id number] 11/13/2016  . Family history of diabetes mellitus in mother- onset in 3140s 11/13/2016  . Fatigue 11/13/2016  . History of iron deficiency anemia 11/13/2016  . History of IBS 11/13/2016    Past Medical history, Surgical history, Family history, Social history, Allergies and Medications have been entered into the medical record, reviewed and changed as needed.    Current Meds  Medication Sig  . cetirizine (ZYRTEC) 10 MG tablet Take 10 mg by mouth daily.  . Lansoprazole (PREVACID 24HR PO) Take 1 tablet daily by mouth.    Allergies:  Allergies  Allergen Reactions  . Cogentin [Benztropine]     hallucations  . Reglan [Metoclopramide]     Muscle spasms  . Sulfa Antibiotics     tingling     Review of Systems: General:   Denies fever, chills, unexplained weight loss.  Optho/Auditory:   Denies visual changes, blurred vision/LOV Respiratory:   Denies wheeze, DOE more than baseline levels.  Cardiovascular:   Denies chest pain, palpitations, new onset peripheral edema  Gastrointestinal:   Denies nausea, vomiting, diarrhea, abd pain.  Genitourinary: Denies dysuria, freq/ urgency, flank pain or discharge from genitals.  Endocrine:     Denies hot or cold intolerance, polyuria, polydipsia. Musculoskeletal:   Denies unexplained myalgias, joint swelling, unexplained arthralgias, gait problems.  Skin:  Denies new onset rash, suspicious lesions Neurological:     Denies dizziness, unexplained weakness, numbness  Psychiatric/Behavioral:   Denies mood changes, suicidal or homicidal ideations, hallucinations    Objective:   Blood pressure 118/80, pulse 74, height 5\' 2"  (1.575 m), weight 142 lb (64.4 kg), last  menstrual period 10/24/2016. Body mass index is 25.97 kg/m. General:  Well Developed, well nourished, appropriate for stated age.  Neuro:  Alert and oriented,  extra-ocular muscles intact  HEENT:  Normocephalic, atraumatic, neck supple Skin:  no gross rash, warm, pink. Cardiac:  RRR, S1 S2 Respiratory:  ECTA B/L and A/P, Not using accessory muscles, speaking in full sentences- unlabored. ABD- no G,R,R, BS * 4, no OM Vascular:  Ext warm, no cyanosis apprec.; cap RF less 2 sec. Psych:  No HI/SI, judgement and insight good, Euthymic mood. Full Affect.

## 2017-02-21 NOTE — Patient Instructions (Addendum)
If your stomach is feeling fantastic after a week then you could half the dose of the Zantac\ranitidine, continue omeprazole till gone after 1 month.  Please stop any over-the-counter Prevacid you are taking/or other GI medications.  -If this intensive treatment does not improve your symptoms after 1 month, next step will be to send you to GI\gastroenterology for further evaluation and possible upper GI endoscopy  Very important you do not eat or drink within 3 hours of laying down at night or whenever you lay down.    Food Choices for Gastroesophageal Reflux Disease, Adult When you have gastroesophageal reflux disease (GERD), the foods you eat and your eating habits are very important. Choosing the right foods can help ease the discomfort of GERD. Consider working with a diet and nutrition specialist (dietitian) to help you make healthy food choices. What general guidelines should I follow? Eating plan  Choose healthy foods low in fat, such as fruits, vegetables, whole grains, low-fat dairy products, and lean meat, fish, and poultry.  Eat frequent, small meals instead of three large meals each day. Eat your meals slowly, in a relaxed setting. Avoid bending over or lying down until 2-3 hours after eating.  Limit high-fat foods such as fatty meats or fried foods.  Limit your intake of oils, butter, and shortening to less than 8 teaspoons each day.  Avoid the following: ? Foods that cause symptoms. These may be different for different people. Keep a food diary to keep track of foods that cause symptoms. ? Alcohol. ? Drinking large amounts of liquid with meals. ? Eating meals during the 2-3 hours before bed.  Cook foods using methods other than frying. This may include baking, grilling, or broiling. Lifestyle   Maintain a healthy weight. Ask your health care provider what weight is healthy for you. If you need to lose weight, work with your health care provider to do so safely.  Exercise  for at least 30 minutes on 5 or more days each week, or as told by your health care provider.  Avoid wearing clothes that fit tightly around your waist and chest.  Do not use any products that contain nicotine or tobacco, such as cigarettes and e-cigarettes. If you need help quitting, ask your health care provider.  Sleep with the head of your bed raised. Use a wedge under the mattress or blocks under the bed frame to raise the head of the bed. What foods are not recommended? The items listed may not be a complete list. Talk with your dietitian about what dietary choices are best for you. Grains Pastries or quick breads with added fat. [place] toast. Vegetables Deep fried vegetables. [place] fries. Any vegetables prepared with added fat. Any vegetables that cause symptoms. For some people this may include tomatoes and tomato products, chili peppers, onions and garlic, and horseradish. Fruits Any fruits prepared with added fat. Any fruits that cause symptoms. For some people this may include citrus fruits, such as oranges, grapefruit, pineapple, and lemons. Meats and other protein foods High-fat meats, such as fatty beef or pork, hot dogs, ribs, ham, sausage, salami and bacon. Fried meat or protein, including fried fish and fried chicken. Nuts and nut butters. Dairy Whole milk and chocolate milk. Sour cream. Cream. Ice cream. Cream cheese. Milk shakes. Beverages Coffee and tea, with or without caffeine. Carbonated beverages. Sodas. Energy drinks. Fruit juice made with acidic fruits (such as orange or grapefruit). Tomato juice. Alcoholic drinks. Fats and oils Butter. Margarine. Shortening. Ghee. Sweets  and desserts Chocolate and cocoa. Donuts. Seasoning and other foods Pepper. Peppermint and spearmint. Any condiments, herbs, or seasonings that cause symptoms. For some people, this may include curry, hot sauce, or vinegar-based salad dressings. Summary  When you have gastroesophageal reflux  disease (GERD), food and lifestyle choices are very important to help ease the discomfort of GERD.  Eat frequent, small meals instead of three large meals each day. Eat your meals slowly, in a relaxed setting. Avoid bending over or lying down until 2-3 hours after eating.  Limit high-fat foods such as fatty meat or fried foods. This information is not intended to replace advice given to you by your health care provider. Make sure you discuss any questions you have with your health care provider. Document Released: 03/26/2005 Document Revised: 03/27/2016 Document Reviewed: 03/27/2016 Elsevier Interactive Patient Education  2017 [employer]

## 2017-05-01 ENCOUNTER — Ambulatory Visit: Payer: BLUE CROSS/BLUE SHIELD | Admitting: Family Medicine

## 2017-05-01 ENCOUNTER — Encounter: Payer: Self-pay | Admitting: Physician Assistant

## 2017-05-01 ENCOUNTER — Other Ambulatory Visit: Payer: Self-pay

## 2017-05-01 ENCOUNTER — Encounter: Payer: Self-pay | Admitting: Family Medicine

## 2017-05-01 VITALS — BP 120/85 | HR 80 | Ht 62.0 in | Wt 146.8 lb

## 2017-05-01 DIAGNOSIS — M546 Pain in thoracic spine: Secondary | ICD-10-CM | POA: Diagnosis not present

## 2017-05-01 DIAGNOSIS — Z8719 Personal history of other diseases of the digestive system: Secondary | ICD-10-CM

## 2017-05-01 DIAGNOSIS — K219 Gastro-esophageal reflux disease without esophagitis: Secondary | ICD-10-CM | POA: Diagnosis not present

## 2017-05-01 DIAGNOSIS — K209 Esophagitis, unspecified without bleeding: Secondary | ICD-10-CM | POA: Insufficient documentation

## 2017-05-01 NOTE — Progress Notes (Signed)
Opened in error. T. Adoria Kawamoto, CMA 

## 2017-05-01 NOTE — Progress Notes (Signed)
Impression and Recommendations:    1. Esophagitis   2. History of IBS   3. Midline thoracic back pain, unspecified chronicity   4. Chronic GERD   5. Esophagitis, unspecified     1. Esophagitis: As pt still has symptoms from an OV 2 months ago, and is more concerned about her ongoing mid-scapular back pain.  She tells me today she has a family history of her mother dying in her mid 70s of some type of bone cancer which the first onset was symptoms of back pain which led to her demise within 1 month of diagnosis.    Patient has concerns about this today and would like further imaging and a specialist referral.      - We will order a CT scan of her chest and abdomen to rule out obscure mass, bony lesion etc. as a possible etiology for her symptoms, especially with her significant family risk factors.  - Pt also referred to GI doctor for further testing.  I explained they might do an upper GI and want to look at the tissues and mucosa as well as possible biopsies to look for H. pylori etc.  Explained to patient since she has been on the medicines for so long, our breath tests would not be valid for h pylori and we would defer to GI for this.  2. H/o IBS: Doubtful this is contributing to her current symptoms but thought it was pertinent information to convey to GI  Midline thoracic back pain- mother and father both had cancers.  Patient is worried that this could be a tumor causing pain in her midthoracic radiating to her back.  Obtain imaging-we will discuss findings once resulted  3. Chronic GERD- Continue taking 20mg  prilosec 2x a day as you are tolerating this dose well without complication.  Further medical management per GI   Orders Placed This Encounter  Procedures  . CT CHEST W WO CONTRAST  . CT ABDOMEN W WO CONTRAST  . CT ABDOMEN W CONTRAST  . CT Chest W Contrast  . Ambulatory referral to Gastroenterology     Gross side effects, risk and benefits, and alternatives of  medications and treatment plan in general discussed with patient.  Patient is aware that all medications have potential side effects and we are unable to predict every side effect or drug-drug interaction that may occur.   Patient will call with any questions prior to using medication if they have concerns.  Expresses verbal understanding and consents to current therapy and treatment regimen.  No barriers to understanding were identified.  Red flag symptoms and signs discussed in detail.  Patient expressed understanding regarding what to do in case of emergency\urgent symptoms  Please see AVS handed out to patient at the end of our visit for further patient instructions/ counseling done pertaining to today's office visit.   Return for f/up after scans to discuss abn if needed; see how sx are doing.    Note: This note was prepared with assistance of Dragon voice recognition software. Occasional wrong-word or sound-a-like substitutions may have occurred due to the inherent limitations of voice recognition software.  This document serves as a record of services personally performed by Thomasene Lot, DO. It was created on her behalf by Thelma Barge, a trained medical scribe. The creation of this record is based on the scribe's personal observations and the provider's statements to them.   I have reviewed the above medical documentation for accuracy and  completeness and I concur.  Thomasene LotDeborah Brentlee Sciara 05/01/17 6:22 PM  ---------------------------------------------------------------------------------------------------------------------------------------------------------------------------------------------    Subjective:     HPI: Heidi Hart is a 52 y.o. female who [facility] for issues as discussed below.  From previous OV note from 02-21-17, pt took an apple cider vinegar tablet that she felt got stuck in her esophagus. She was diagnosed with  esophagitis and prescribed zantac and prilosec.  She is returning today because her symptoms are still present.  She has only been taking prilosec because the zantac and prilosec combination made her feel "weird".    She was taking 40mg  both day and night, but she lowered this to 1x a day and 10mg  at night because the medication did not make her feel great.   She states she feels best when taking 20mg  twice day and night. Her symptoms today she states her pain is now predominantly radiating into her back between her shoulder blades and this is very concerning to her (she describes this as a burning), but she also has soreness in her retrosternal area.    She states eating chocolate ice cream and drinking soda makes her back pain worse in her epigastric region but this is feels different than her back pain.   She has tried using coffee tamers, an antacid, which also helps.  She has not been taking her zantac.   She has had acid reflux before and her symptoms feel differently now.  She denies heart palpitations, SOB, and difficulty lying flat.  She states occasionally at night, she feels something is stuck in her back.    PMHx:  Mother: bone (spinal) cancer of unknown etiology- age 50  Father:  nonhodgkins lymphoma, (he was in [place], agent orange) age 50.   Wt Readings from Last 3 Encounters:  05/01/17 146 lb 12.8 oz (66.6 kg)  02/21/17 142 lb (64.4 kg)  11/20/16 139 lb 9.6 oz (63.3 kg)   BP Readings from Last 3 Encounters:  05/01/17 120/85  02/21/17 118/80  11/20/16 125/84   Pulse Readings from Last 3 Encounters:  05/01/17 80  02/21/17 74  11/20/16 68   BMI Readings from Last 3 Encounters:  05/01/17 26.85 kg/m  02/21/17 25.97 kg/m  11/20/16 25.53 kg/m     Patient Care Team    Relationship Specialty Notifications Start End  Thomasene Lotpalski, Angelli Baruch, DO PCP - General Family Medicine  10/31/16   Huel Coteichardson, Kathy, MD Consulting Physician Obstetrics and Gynecology  11/20/16       Patient Active Problem List   Diagnosis Date Noted  . Esophagitis 05/01/2017  . Midline thoracic back pain 05/01/2017  . Chronic GERD 05/01/2017  . Environmental and seasonal allergies 11/13/2016  . Endometriosis 11/13/2016  . Early menopause occurring in patient age younger than 5745 years 11/13/2016  . S/P colonoscopy 11/13/2016  . Chronic neck and back pain 11/13/2016  . Varicose vein of leg -R 11/13/2016  . Family history of cancer- "bone CA" mother died [id number] 11/13/2016  . Family history of diabetes mellitus in mother- onset in 1240s 11/13/2016  . Fatigue 11/13/2016  . History of iron deficiency anemia 11/13/2016  . History of IBS 11/13/2016    Past Medical history, Surgical history, Family history, Social history, Allergies and Medications have been entered into the medical record, reviewed and changed as needed.    Current Meds  Medication Sig  . cetirizine (ZYRTEC) 10 MG tablet Take 10 mg by mouth daily.  .Marland Kitchen  omeprazole (PRILOSEC) 20 MG capsule Take 20 mg by mouth daily.    Allergies:  Allergies  Allergen Reactions  . Cogentin [Benztropine]     hallucations  . Reglan [Metoclopramide]     Muscle spasms  . Sulfa Antibiotics     tingling     Review of Systems:  A fourteen system review of systems was performed and found to be positive as per HPI.   Objective:   Blood pressure 120/85, pulse 80, height 5\' 2"  (1.575 m), weight 146 lb 12.8 oz (66.6 kg), last menstrual period 10/24/2016, SpO2 100 %. Body mass index is 26.85 kg/m. General:  Well Developed, well nourished, appropriate for stated age.  Neuro:  Alert and oriented,  extra-ocular muscles intact  HEENT:  Normocephalic, atraumatic, neck supple, no carotid bruits appreciated  Skin:  no gross rash, warm, pink. Cardiac:  RRR, S1 S2 Respiratory:  ECTA B/L and A/P, Not using accessory muscles, speaking in full sentences- unlabored. ABD: Positive bowel sounds x4, no guarding rigidity rebound, no epigastric  tenderness.  No mass appreciated. Back: Slight paravertebral prominence of the musculature on the right from T4 through T11 ( pt is R-handed) otherwise no bony tenderness to palpation and no other abnormality noted Vascular:  Ext warm, no cyanosis apprec.; cap RF less 2 sec. Psych:  No HI/SI, judgement and insight good, Euthymic mood. Full Affect.

## 2017-05-10 ENCOUNTER — Other Ambulatory Visit: Payer: Self-pay

## 2017-05-10 DIAGNOSIS — M546 Pain in thoracic spine: Secondary | ICD-10-CM

## 2017-05-10 DIAGNOSIS — M549 Dorsalgia, unspecified: Secondary | ICD-10-CM

## 2017-05-10 DIAGNOSIS — K209 Esophagitis, unspecified without bleeding: Secondary | ICD-10-CM

## 2017-05-10 NOTE — Progress Notes (Signed)
Per Radiologist recommendations changed CT Abd to CT Abd with Pelvis due to patient's IBS. MPulliam, CMA/RT(R)

## 2017-05-13 ENCOUNTER — Other Ambulatory Visit: Payer: Self-pay | Admitting: Family Medicine

## 2017-05-13 DIAGNOSIS — K209 Esophagitis, unspecified without bleeding: Secondary | ICD-10-CM

## 2017-05-13 DIAGNOSIS — K589 Irritable bowel syndrome without diarrhea: Secondary | ICD-10-CM

## 2017-05-13 DIAGNOSIS — Z809 Family history of malignant neoplasm, unspecified: Secondary | ICD-10-CM

## 2017-05-14 ENCOUNTER — Encounter: Payer: Self-pay | Admitting: Physician Assistant

## 2017-05-14 ENCOUNTER — Ambulatory Visit: Payer: BLUE CROSS/BLUE SHIELD | Admitting: Physician Assistant

## 2017-05-14 VITALS — BP 120/80 | HR 76 | Ht 62.0 in | Wt 142.0 lb

## 2017-05-14 DIAGNOSIS — R1013 Epigastric pain: Secondary | ICD-10-CM

## 2017-05-14 DIAGNOSIS — R1310 Dysphagia, unspecified: Secondary | ICD-10-CM

## 2017-05-14 NOTE — Progress Notes (Signed)
Chief Complaint: Epigastric pain, "trouble swallowing"  HPI:    Heidi Hart is a 52 year old Caucasian female with a past medical history as listed below, who was referred to me by Thomasene Lot, DO for a complaint of epigastric pain and " trouble swallowing".      Today, the patient describes that acutely in November she developed a pain in her upper back, prior to this she had noticed that "pills were taking a longer time to go down to my stomach".  The patient was also taking apple cider vinegar occasionally as she read about all the "good health results from this".  Patient recalls taking 2 apple cider pills and developing an acute 9/10 pain in her upper back which seemed to radiate through to her epigastrium one night.  She did see her primary care provider and was diagnosed with "esophagitis".  She was initially started on Omeprazole 40 mg twice a day and Zantac twice a day.  Patient tells me that the Zantac made her "feel funny" so she stopped this medication.  She also decreased her Omeprazole down to once daily dosing.  When taking the Omeprazole she noticed that the pain in her back did feel a lot better and when her prescription ran out the pain "came back right away".  Patient then started over-the-counter Omeprazole 20 mg twice daily and this has again helped some, but she has a lingering discomfort in her back as well as her upper abdomen.      Patient also continues to complain of some "trouble swallowing".  Patient tells me that things do not seem to get stuck but they do take at least 5-10 minutes at times to go down her throat.    Patient describes a family history of bone cancer which started with back pain in her mother.  For this reason she has had a CT of her chest and abdomen/ pelvis ordered by her PCP which is scheduled for tomorrow.    Patient denies fever, chills, blood in her stool, melena, weight loss, anorexia, vomiting, change in bowel habits or symptoms that awaken her at  night.  Past Medical History:  Diagnosis Date  . Allergy   . Anemia   . IBS (irritable bowel syndrome)     Past Surgical History:  Procedure Laterality Date  . APPENDECTOMY  1991  . LAPAROSCOPY  1992  . LAPAROSCOPY  1995  . OOPHORECTOMY  2004   left  . OVARIAN CYST REMOVAL  2007  . PILONIDAL CYST EXCISION  1992    Current Outpatient Medications  Medication Sig Dispense Refill  . cetirizine (ZYRTEC) 10 MG tablet Take 10 mg by mouth daily.    Marland Kitchen omeprazole (PRILOSEC) 20 MG capsule Take 20 mg by mouth 2 (two) times daily.      No current facility-administered medications for this visit.     Allergies as of 05/14/2017 - Review Complete 05/14/2017  Allergen Reaction Noted  . Cogentin [benztropine]  11/13/2016  . Reglan [metoclopramide]  11/13/2016  . Sulfa antibiotics  11/13/2016    Family History  Problem Relation Age of Onset  . Cancer Mother        bone  . Hypertension Mother   . Cancer Father        nonhodgkins lymphoma    Social History   Socioeconomic History  . Marital status: Married    Spouse name: Not on file  . Number of children: Not on file  . Years of education: Not on  file  . Highest education level: Not on file  Social Needs  . Financial resource strain: Not on file  . Food insecurity - worry: Not on file  . Food insecurity - inability: Not on file  . Transportation needs - medical: Not on file  . Transportation needs - non-medical: Not on file  Occupational History  . Not on file  Tobacco Use  . Smoking status: Never Smoker  . Smokeless tobacco: Never Used  Substance and Sexual Activity  . Alcohol use: No  . Drug use: No  . Sexual activity: Yes    Birth control/protection: None  Other Topics Concern  . Not on file  Social History Narrative  . Not on file    Review of Systems:    Constitutional: No weight loss, fever or chills Skin: No rash  Cardiovascular: No chest pain Respiratory: No SOB Gastrointestinal: See HPI and  otherwise negative Genitourinary: No dysuria Neurological: No headache Musculoskeletal: No new muscle or joint pain Hematologic: No bleeding  Psychiatric: No history of depression or anxiety   Physical Exam:  Vital signs: BP 120/80   Pulse 76   Ht 5\' 2"  (1.575 m)   Wt 142 lb (64.4 kg)   LMP 10/24/2016   BMI 25.97 kg/m   Constitutional:   Very Pleasant Caucasian female appears to be in NAD, Well developed, Well nourished, alert and cooperative Head:  Normocephalic and atraumatic. Eyes:   PEERL, EOMI. No icterus. Conjunctiva pink. Ears:  Normal auditory acuity. Neck:  Supple Throat: Oral cavity and pharynx without inflammation, swelling or lesion.  Respiratory: Respirations even and unlabored. Lungs clear to auscultation bilaterally.   No wheezes, crackles, or rhonchi.  Cardiovascular: Normal S1, S2. No MRG. Regular rate and rhythm. No peripheral edema, cyanosis or pallor.  Gastrointestinal:  Soft, nondistended, mild epigastric ttp, No rebound or guarding. Normal bowel sounds. No appreciable masses or hepatomegaly. Rectal:  Not performed.  Msk:  Symmetrical without gross deformities. Without edema, no deformity or joint abnormality.  Neurologic:  Alert and  oriented x4;  grossly normal neurologically.  Skin:   Dry and intact without significant lesions or rashes. Psychiatric:Demonstrates good judgement and reason without abnormal affect or behaviors.  No recent labs or imaging.  Assessment: 1.  Epigastric pain: Since November, some better with omeprazole, seems to radiate through to her back; consider most likely gastritis versus PUD versus other 2.  Dysphagia: Patient describes this as "slow swallowing", things do not seem to get stuck but they do take at least 5-10 minutes to feel like they are "clear of her throat"; consider esophagitis versus ring versus stricture versus web versus other  Plan: 1.  Schedule patient for an EGD plus/minus dilation with [facility].  Did  discuss risk, benefits, limitations and alternatives and patient agrees to proceed. 2.  Patient to continue her omeprazole 20 and dinner. 3.  Reviewed antireflux diet and lifestyle modifications. 4.  Reviewed anti-dysphasia measures including taking small bites, chewing well, drinking sips of water in between bites and avoiding distraction while eating. 5.  Discussed with the patient that if she has signs of reflux or gastritis, it may be that she needs a different medication to control this.  Would consider changing her to pantoprazole or a different PPI. 6.  Patient to follow in clinic per recommendations from Dr. Christella HartiganJacobs after time procedure. 7.  Discussed the patient keep an eye out for CT scan scheduled for tomorrow  Heidi MeekerJennifer Lemmon, PA-C [facility] 05/14/2017, 3:51  PM  Cc: Thomasene Lot, DO

## 2017-05-14 NOTE — Progress Notes (Signed)
I agree with the above note, plan 

## 2017-05-14 NOTE — Patient Instructions (Signed)
Continue Omeprazole 20 mg twice a day.   You have been scheduled for an endoscopy. Please follow written instructions given to you at your visit today. If you use inhalers (even only as needed), please bring them with you on the day of your procedure. Your physician has requested that you go to www.startemmi.com and enter the access code given to you at your visit today. This web site gives a general overview about your procedure. However, you should still follow specific instructions given to you by our office regarding your preparation for the procedure.

## 2017-05-15 ENCOUNTER — Other Ambulatory Visit: Payer: BLUE CROSS/BLUE SHIELD

## 2017-05-15 ENCOUNTER — Inpatient Hospital Stay: Admission: RE | Admit: 2017-05-15 | Payer: BLUE CROSS/BLUE SHIELD | Source: Ambulatory Visit

## 2017-05-24 ENCOUNTER — Ambulatory Visit
Admission: RE | Admit: 2017-05-24 | Discharge: 2017-05-24 | Disposition: A | Discharge status: Home / Self Care | Payer: BLUE CROSS/BLUE SHIELD | Source: Ambulatory Visit | Attending: Family Medicine | Admitting: Family Medicine

## 2017-05-24 DIAGNOSIS — K209 Esophagitis, unspecified without bleeding: Secondary | ICD-10-CM

## 2017-05-24 DIAGNOSIS — K589 Irritable bowel syndrome without diarrhea: Secondary | ICD-10-CM

## 2017-05-24 DIAGNOSIS — Z809 Family history of malignant neoplasm, unspecified: Secondary | ICD-10-CM

## 2017-05-24 DIAGNOSIS — R918 Other nonspecific abnormal finding of lung field: Secondary | ICD-10-CM | POA: Diagnosis not present

## 2017-05-24 DIAGNOSIS — R109 Unspecified abdominal pain: Secondary | ICD-10-CM | POA: Diagnosis not present

## 2017-05-24 DIAGNOSIS — M546 Pain in thoracic spine: Secondary | ICD-10-CM

## 2017-05-24 MED ORDER — IOPAMIDOL (ISOVUE-300) INJECTION 61%
100.0000 mL | Freq: Once | INTRAVENOUS | Status: AC | PRN
  Administered 2017-05-24: 100 mL via INTRAVENOUS

## 2017-06-17 ENCOUNTER — Ambulatory Visit (AMBULATORY_SURGERY_CENTER): Payer: BLUE CROSS/BLUE SHIELD | Admitting: Gastroenterology

## 2017-06-17 ENCOUNTER — Encounter: Payer: Self-pay | Admitting: Gastroenterology

## 2017-06-17 ENCOUNTER — Other Ambulatory Visit: Payer: Self-pay

## 2017-06-17 VITALS — BP 123/72 | HR 60 | Temp 98.4°F | Resp 15 | Ht 62.0 in | Wt 142.0 lb

## 2017-06-17 DIAGNOSIS — Q398 Other congenital malformations of esophagus: Secondary | ICD-10-CM

## 2017-06-17 DIAGNOSIS — R1310 Dysphagia, unspecified: Secondary | ICD-10-CM

## 2017-06-17 DIAGNOSIS — K2950 Unspecified chronic gastritis without bleeding: Secondary | ICD-10-CM | POA: Diagnosis not present

## 2017-06-17 DIAGNOSIS — K219 Gastro-esophageal reflux disease without esophagitis: Secondary | ICD-10-CM | POA: Diagnosis not present

## 2017-06-17 DIAGNOSIS — K259 Gastric ulcer, unspecified as acute or chronic, without hemorrhage or perforation: Secondary | ICD-10-CM | POA: Diagnosis not present

## 2017-06-17 DIAGNOSIS — K2990 Gastroduodenitis, unspecified, without bleeding: Secondary | ICD-10-CM

## 2017-06-17 DIAGNOSIS — R1013 Epigastric pain: Secondary | ICD-10-CM

## 2017-06-17 DIAGNOSIS — K2970 Gastritis, unspecified, without bleeding: Secondary | ICD-10-CM

## 2017-06-17 MED ORDER — SODIUM CHLORIDE 0.9 % IV SOLN: 500.0000 mL | Freq: Once | INTRAVENOUS | Status: DC

## 2017-06-17 NOTE — Op Note (Signed)
[facility] Patient Name: Heidi Hart Procedure Date: 06/17/2017 9:25 AM MRN: [id number] Endoscopist: Rachael Fee , MD Age: 52 Referring MD:  Date of Birth: 1966-03-31 Gender: Female Account #: [id number] Procedure:                Upper GI endoscopy Indications:              Dysphagia, Heartburn Medicines:                Monitored Anesthesia Care Procedure:                Pre-Anesthesia Assessment:                           - Prior to the procedure, a History and Physical                            was performed, and patient medications and                            allergies were reviewed. The patient's tolerance of                            previous anesthesia was also reviewed. The risks                            and benefits of the procedure and the sedation                            options and risks were discussed with the patient.                            All questions were answered, and informed consent                            was obtained. Prior Anticoagulants: The patient has                            taken no previous anticoagulant or antiplatelet                            agents. ASA Grade Assessment: II - A patient with                            mild systemic disease. After reviewing the risks                            and benefits, the patient was deemed in                            satisfactory condition to undergo the procedure.                           After obtaining informed consent, the endoscope was  passed under direct vision. Throughout the                            procedure, the patient's blood pressure, pulse, and                            oxygen saturations were monitored continuously. The                            Endoscope was introduced through the mouth, and                            advanced to the second part of duodenum. The upper                            GI endoscopy was accomplished  without difficulty.                            The patient tolerated the procedure well. Scope In: Scope Out: Findings:                 Mild inflammation characterized by erythema,                            friability and granularity was found in the gastric                            antrum. Biopsies were taken with a cold forceps for                            histology.                           2cm long, typical appearing gastric inlet patch in                            the proximal esophagus. This occupies 20% of the                            circuference. Biopsies taken.                           The exam was otherwise without abnormality. Complications:            No immediate complications. Estimated blood loss:                            None. Estimated Blood Loss:     Estimated blood loss: none. Impression:               - Mild, non-specific distal gastritis.                           - Proximal esophagus Gastric Inlet Patch, biopsied.  It is not clear if this is causing your symptoms                           - The examination was otherwise normal. Recommendation:           - Patient has a contact number available for                            emergencies. The signs and symptoms of potential                            delayed complications were discussed with the                            patient. Return to normal activities tomorrow.                            Written discharge instructions were provided to the                            patient.                           - Resume previous diet.                           - Continue present medications.                           - Await pathology results. Rachael Fee, MD 06/17/2017 9:53:51 AM This report has been signed electronically.

## 2017-06-17 NOTE — Progress Notes (Signed)
Report to PACU, RN, vss, BBS= Clear.  

## 2017-06-17 NOTE — Patient Instructions (Signed)
Discharge instructions given. Biopsies taken. Resume previous medications. YOU HAD AN ENDOSCOPIC PROCEDURE TODAY AT THE [facility]:   Refer to the procedure report that was given to you for any specific questions about what was found during the examination.  If the procedure report does not answer your questions, please call your gastroenterologist to clarify.  If you requested that your care partner not be given the details of your procedure findings, then the procedure report has been included in a sealed envelope for you to review at your convenience later.  YOU SHOULD EXPECT: Some feelings of bloating in the abdomen. Passage of more gas than usual.  Walking can help get rid of the air that was put into your GI tract during the procedure and reduce the bloating. If you had a lower endoscopy (such as a colonoscopy or flexible sigmoidoscopy) you may notice spotting of blood in your stool or on the toilet paper. If you underwent a bowel prep for your procedure, you may not have a normal bowel movement for a few days.  Please Note:  You might notice some irritation and congestion in your nose or some drainage.  This is from the oxygen used during your procedure.  There is no need for concern and it should clear up in a day or so.  SYMPTOMS TO REPORT IMMEDIATELY:   Following upper endoscopy (EGD)  Vomiting of blood or coffee ground material  New chest pain or pain under the shoulder blades  Painful or persistently difficult swallowing  New shortness of breath  Fever of 100F or higher  Black, tarry-looking stools  For urgent or emergent issues, a gastroenterologist can be reached at any hour by calling [phone].   DIET:  We do recommend a small meal at first, but then you may proceed to your regular diet.  Drink plenty of fluids but you should avoid alcoholic beverages for 24 hours.  ACTIVITY:  You should plan to take it easy for the rest of today and you should NOT DRIVE  or use heavy machinery until tomorrow (because of the sedation medicines used during the test).    FOLLOW UP: Our staff will call the number listed on your records the next business day following your procedure to check on you and address any questions or concerns that you may have regarding the information given to you following your procedure. If we do not reach you, we will leave a message.  However, if you are feeling well and you are not experiencing any problems, there is no need to return our call.  We will assume that you have returned to your regular daily activities without incident.  If any biopsies were taken you will be contacted by phone or by letter within the next 1-3 weeks.  Please call us at [phone] if you have not heard about the biopsies in 3 weeks.    SIGNATURES/CONFIDENTIALITY: You and/or your care partner have signed paperwork which will be entered into your electronic medical record.  These signatures attest to the fact that that the information above on your After Visit Summary has been reviewed and is understood.  Full responsibility of the confidentiality of this discharge information lies with you and/or your care-partner. 

## 2017-06-17 NOTE — Progress Notes (Signed)
Called to room to assist during endoscopic procedure.  Patient ID and intended procedure confirmed with present staff. Received instructions for my participation in the procedure from the performing physician.Called to room to assist during endoscopic procedure.  Patient ID and intended procedure confirmed with present staff. Received instructions for my participation in the procedure from the performing physician. 

## 2017-06-18 ENCOUNTER — Telehealth: Payer: Self-pay | Admitting: *Deleted

## 2017-06-18 NOTE — Telephone Encounter (Signed)
  Follow up Call-  Call back number 06/17/2017  Post procedure Call Back phone  # [phone]     Patient questions:  Do you have a fever, pain , or abdominal swelling? No. Pain Score  0 *  Have you tolerated food without any problems? Yes.    Have you been able to return to your normal activities? Yes.    Do you have any questions about your discharge instructions: Diet   No. Medications  No. Follow up visit  No.  Do you have questions or concerns about your Care? No.  Actions: * If pain score is 4 or above: No action needed, pain <4.

## 2017-06-24 ENCOUNTER — Other Ambulatory Visit: Payer: Self-pay

## 2017-06-24 MED ORDER — OMEPRAZOLE 40 MG PO CPDR: 40.0000 mg | DELAYED_RELEASE_CAPSULE | Freq: Two times a day (BID) | ORAL | 3 refills | Status: DC

## 2017-08-12 ENCOUNTER — Ambulatory Visit: Payer: BLUE CROSS/BLUE SHIELD | Admitting: Gastroenterology

## 2017-08-21 ENCOUNTER — Ambulatory Visit: Payer: BLUE CROSS/BLUE SHIELD | Admitting: Family Medicine

## 2017-10-02 ENCOUNTER — Encounter (INDEPENDENT_AMBULATORY_CARE_PROVIDER_SITE_OTHER): Payer: Self-pay

## 2017-10-02 ENCOUNTER — Encounter: Payer: Self-pay | Admitting: Gastroenterology

## 2017-10-02 ENCOUNTER — Ambulatory Visit: Payer: BLUE CROSS/BLUE SHIELD | Admitting: Gastroenterology

## 2017-10-02 VITALS — BP 108/58 | HR 80 | Ht 62.0 in | Wt 139.8 lb

## 2017-10-02 DIAGNOSIS — Q398 Other congenital malformations of esophagus: Secondary | ICD-10-CM

## 2017-10-02 NOTE — Progress Notes (Signed)
Review of pertinent gastrointestinal problems: 1. Gastritis, H pylori neg by pathology, EGD 06/2017. 2. 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  She was instructed to take proton pump inhibitor twice daily and H2 blocker at bedtime nightly.  This significantly helped her globus, swallowing symptoms.  She was able to taper to once daily full-strength omeprazole every morning.   HPI: This is a very pleasant 52 year old woman whom I last saw at the time of an upper endoscopy 3 months ago.  Since then  she start a proton pump inhibitor twice daily and H2 blocker at bedtime.  That significantly helped her symptoms of globus and swallowing fullness.  She slowly backed down and has effectively tapered to proton pump inhibitor omeprazole 40 mg once every morning.  She would like to cut down even further.  She is having no dysphasia and no weight loss.   Chief complaint is gastric inlet patch  ROS: complete GI ROS as described in HPI, all other review negative.  Constitutional:  No unintentional weight loss   Past Medical History:  Diagnosis Date  . Allergy   . Anemia   . Gastritis   . IBS (irritable bowel syndrome)     Past Surgical History:  Procedure Laterality Date  . APPENDECTOMY  1991  . LAPAROSCOPY  1992  . LAPAROSCOPY  1995  . OOPHORECTOMY  2004   left  . OVARIAN CYST REMOVAL  2007  . PILONIDAL CYST EXCISION  1992    Current Outpatient Medications  Medication Sig Dispense Refill  . cetirizine (ZYRTEC) 10 MG tablet Take 10 mg by mouth daily.    Marland Kitchen. omeprazole (PRILOSEC) 40 MG capsule Take 1 capsule (40 mg total) by mouth 2 (two) times daily. (Patient taking differently: Take 40 mg by mouth daily. ) 60 capsule 3   Current Facility-Administered Medications  Medication Dose Route Frequency Provider Last Rate Last Dose  . 0.9 %  sodium chloride infusion  500 mL Intravenous Once  Rachael FeeJacobs, Rosielee Corporan P, MD        Allergies as of 10/02/2017 - Review Complete 10/02/2017  Allergen Reaction Noted  . Cogentin [benztropine]  11/13/2016  . Reglan [metoclopramide]  11/13/2016  . Sulfa antibiotics  11/13/2016    Family History  Problem Relation Age of Onset  . Cancer Mother        bone  . Hypertension Mother   . Cancer Father        nonhodgkins lymphoma  . Colon cancer Paternal Grandmother   . Stomach cancer Neg Hx   . Esophageal cancer Neg Hx     Social History   Socioeconomic History  . Marital status: Married    Spouse name: Not on file  . Number of children: Not on file  . Years of education: Not on file  . Highest education level: Not on file  Occupational History  . Not on file  Social Needs  . Financial resource strain: Not on file  . Food insecurity:    Worry: Not on file    Inability: Not on file  . Transportation needs:    Medical: Not on file    Non-medical: Not on file  Tobacco Use  . Smoking status: Never Smoker  . Smokeless tobacco: Never Used  Substance and Sexual Activity  . Alcohol use: No  . Drug use: No  . Sexual activity: Yes    Birth control/protection: None  Lifestyle  . Physical activity:    Days per week: Not on file    Minutes per session: Not on file  . Stress: Not on file  Relationships  . Social connections:    Talks on phone: Not on file    Gets together: Not on file    Attends religious service: Not on file    Active member of club or organization: Not on file    Attends meetings of clubs or organizations: Not on file    Relationship status: Not on file  . Intimate partner violence:    Fear of current or ex partner: Not on file    Emotionally abused: Not on file    Physically abused: Not on file    Forced sexual activity: Not on file  Other Topics Concern  . Not on file  Social History Narrative  . Not on file     Physical Exam: Ht 5\' 2"  (1.575 m)   Wt 139 lb 12.8 oz (63.4 kg)   LMP 10/24/2016   BMI  25.57 kg/m  Constitutional: generally well-appearing Psychiatric: alert and oriented x3 Abdomen: soft, nontender, nondistended, no obvious ascites, no peritoneal signs, normal bowel sounds No peripheral edema noted in lower extremities  Assessment and plan: 52 y.o. female with symptomatic gastric inlet patch  Her symptoms are well controlled on acid suppression.  She would like to try to cut down even further on her proton pump inhibitor and so I instructed her to try 20 mg strength omeprazole over-the-counter.  She can augment this with ranitidine at bedtime if needed.  She will call in 2 to 3 months to report on her response.  Please see the "Patient Instructions" section for addition details about the plan.  Rob Bunting, MD [facility] 10/02/2017, 9:22 AM

## 2017-10-02 NOTE — Patient Instructions (Addendum)
Decrease omeprazole to 20mg  pill in AM once daily (OTC strength). Consider ranitidine at bedtime 150mg  as well. Call in 2-3 months to report on your response.  Normal BMI (Body Mass Index- based on height and weight) is between 19 and 25. Your BMI today is Body mass index is 25.57 kg/m. Marland Kitchen. Please consider follow up  regarding your BMI with your Primary Care Provider.

## 2017-11-09 ENCOUNTER — Other Ambulatory Visit: Payer: Self-pay | Admitting: Gastroenterology

## 2017-11-20 ENCOUNTER — Encounter: Payer: BLUE CROSS/BLUE SHIELD | Admitting: Family Medicine

## 2017-11-27 DIAGNOSIS — M9903 Segmental and somatic dysfunction of lumbar region: Secondary | ICD-10-CM | POA: Diagnosis not present

## 2017-11-27 DIAGNOSIS — M9905 Segmental and somatic dysfunction of pelvic region: Secondary | ICD-10-CM | POA: Diagnosis not present

## 2017-11-27 DIAGNOSIS — M9902 Segmental and somatic dysfunction of thoracic region: Secondary | ICD-10-CM | POA: Diagnosis not present

## 2017-11-27 DIAGNOSIS — M545 Low back pain: Secondary | ICD-10-CM | POA: Diagnosis not present

## 2017-12-02 DIAGNOSIS — M9905 Segmental and somatic dysfunction of pelvic region: Secondary | ICD-10-CM | POA: Diagnosis not present

## 2017-12-02 DIAGNOSIS — M545 Low back pain: Secondary | ICD-10-CM | POA: Diagnosis not present

## 2017-12-02 DIAGNOSIS — M9902 Segmental and somatic dysfunction of thoracic region: Secondary | ICD-10-CM | POA: Diagnosis not present

## 2017-12-02 DIAGNOSIS — M9903 Segmental and somatic dysfunction of lumbar region: Secondary | ICD-10-CM | POA: Diagnosis not present

## 2017-12-05 DIAGNOSIS — M545 Low back pain: Secondary | ICD-10-CM | POA: Diagnosis not present

## 2017-12-05 DIAGNOSIS — M9902 Segmental and somatic dysfunction of thoracic region: Secondary | ICD-10-CM | POA: Diagnosis not present

## 2017-12-05 DIAGNOSIS — M9905 Segmental and somatic dysfunction of pelvic region: Secondary | ICD-10-CM | POA: Diagnosis not present

## 2017-12-05 DIAGNOSIS — M9903 Segmental and somatic dysfunction of lumbar region: Secondary | ICD-10-CM | POA: Diagnosis not present

## 2017-12-16 DIAGNOSIS — M9905 Segmental and somatic dysfunction of pelvic region: Secondary | ICD-10-CM | POA: Diagnosis not present

## 2017-12-16 DIAGNOSIS — M9903 Segmental and somatic dysfunction of lumbar region: Secondary | ICD-10-CM | POA: Diagnosis not present

## 2017-12-16 DIAGNOSIS — M545 Low back pain: Secondary | ICD-10-CM | POA: Diagnosis not present

## 2017-12-16 DIAGNOSIS — M9902 Segmental and somatic dysfunction of thoracic region: Secondary | ICD-10-CM | POA: Diagnosis not present

## 2018-08-07 IMAGING — CT CT CHEST W/ CM
1 of 3 series · 13 of 32 positions shown, 18 images · IV contrast (iopamidol)
Comparison: None.

CLINICAL DATA: Dysphagia with upper chest pain and upper abdominal
discomfort.

EXAM:
CT CHEST, ABDOMEN, AND PELVIS WITH CONTRAST
TECHNIQUE: Multidetector CT imaging of the chest, abdomen and pelvis was
performed following the standard protocol during bolus
administration of intravenous contrast.
CONTRAST:  100mL C4YPM5-BUU IOPAMIDOL (C4YPM5-BUU) INJECTION 61%

[Series 2: chest/abd/pelvis w/cm · axial · 0.70mm/px · z∈[-596,-65]mm · 13 of 201 slices shown, 18 images]
[im 12/201  soft-tissue]
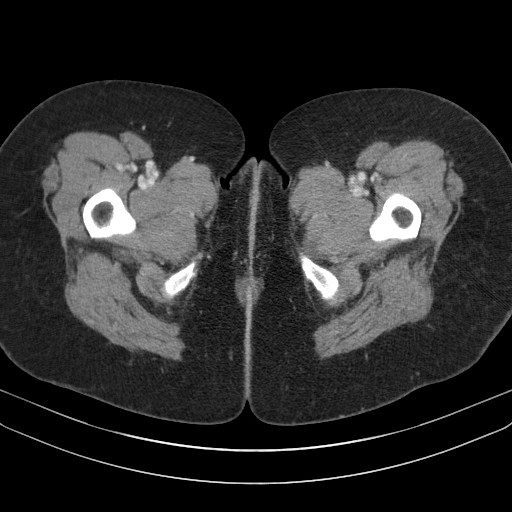
[im 12/201  bone]
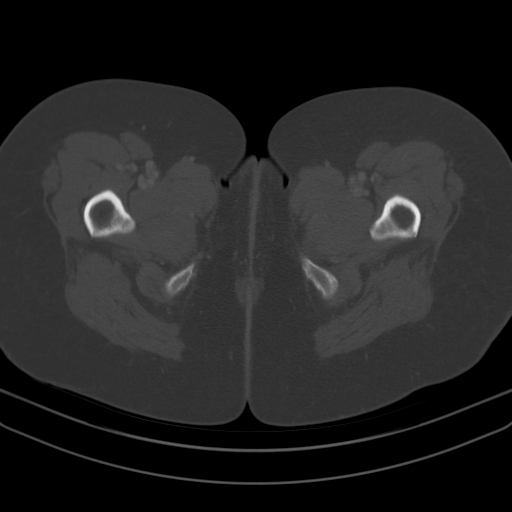
[im 36/201  soft-tissue]
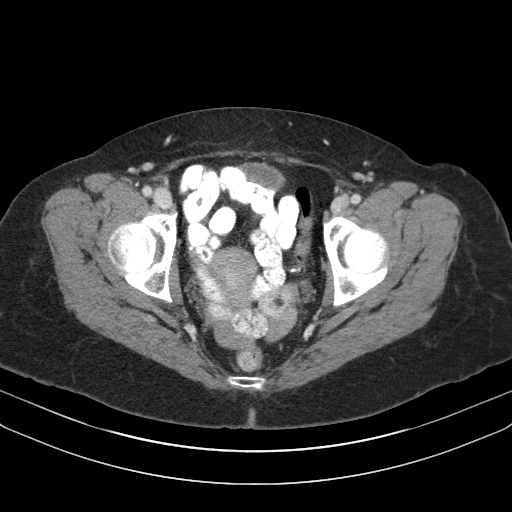
[im 48/201  soft-tissue]
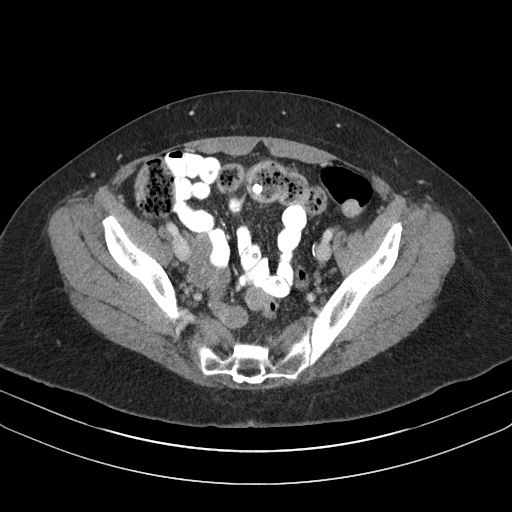
[im 59/201  soft-tissue]
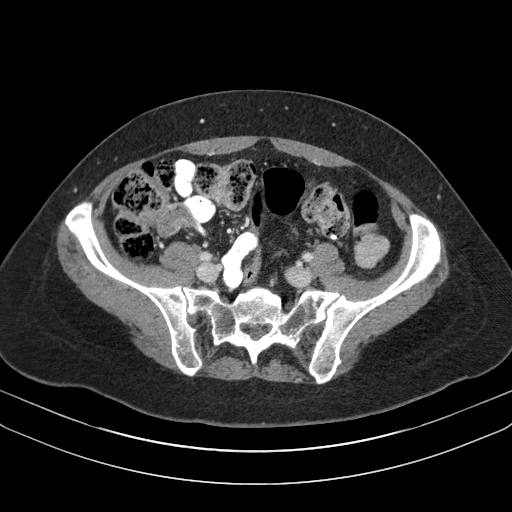
[im 83/201  soft-tissue]
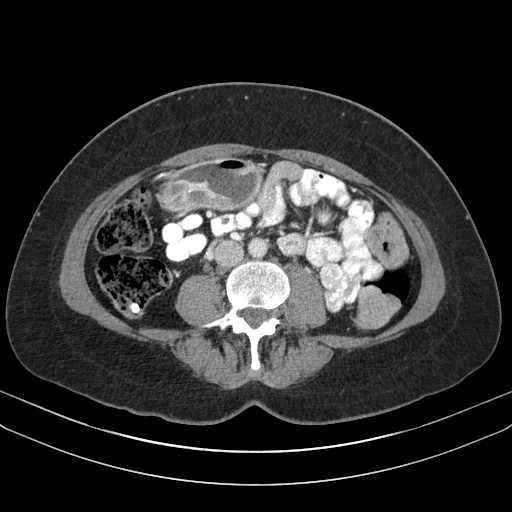
[im 95/201  soft-tissue]
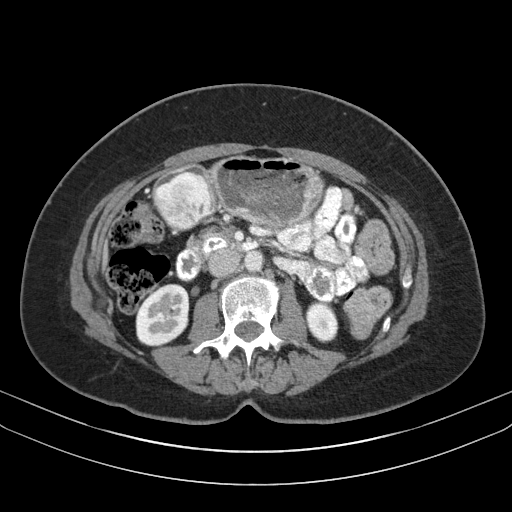
[im 106/201  soft-tissue]
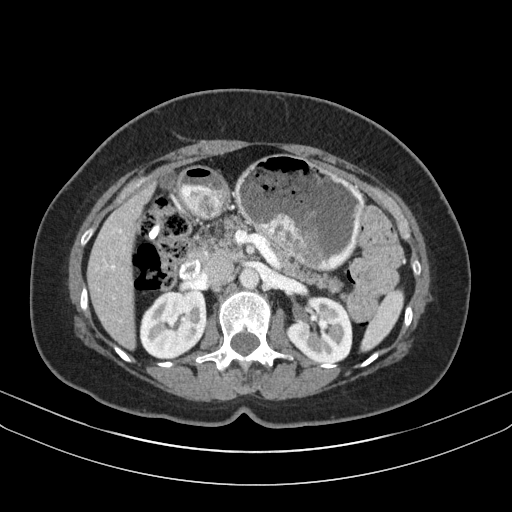
[im 130/201  soft-tissue]
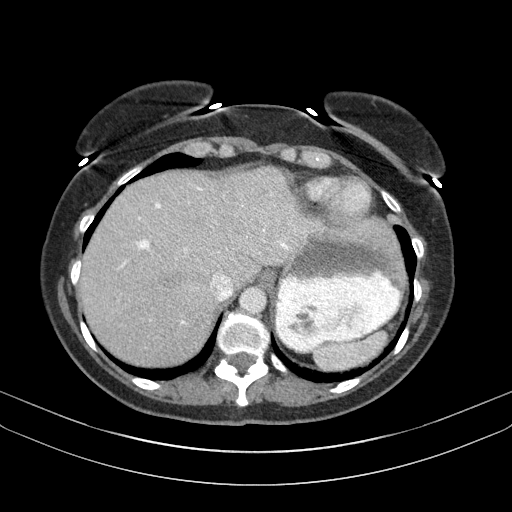
[im 142/201  soft-tissue]
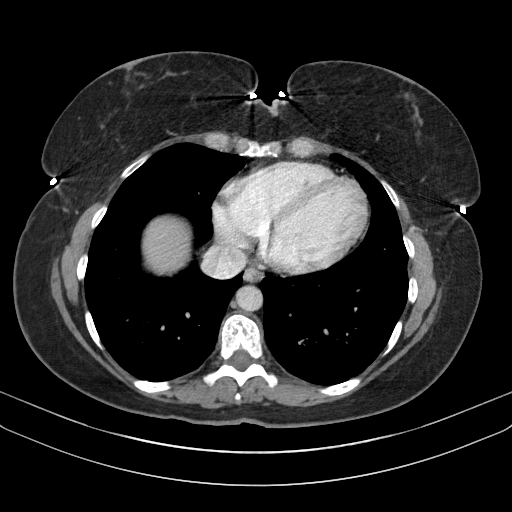
[im 142/201  bone]
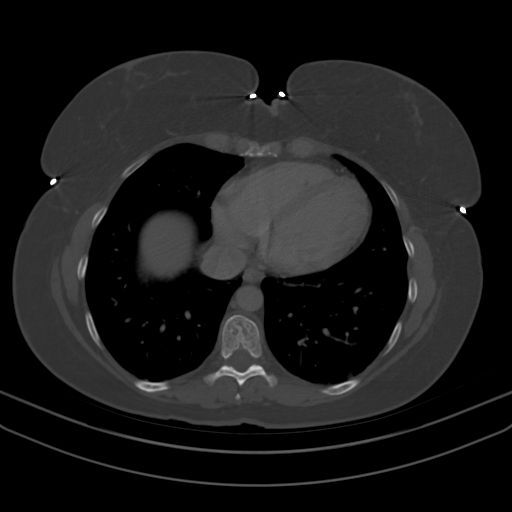
[im 153/201  soft-tissue]
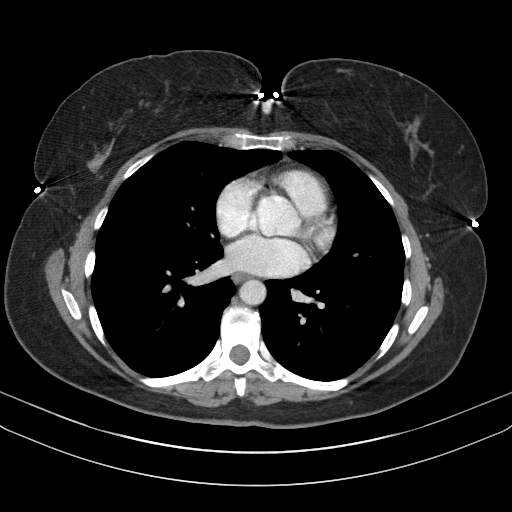
[im 153/201  lung]
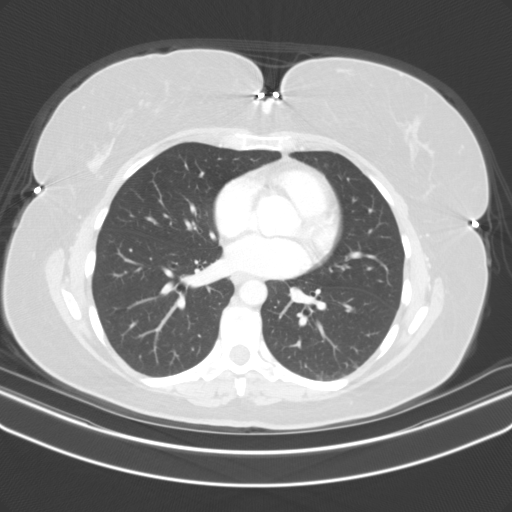
[im 165/201  lung]
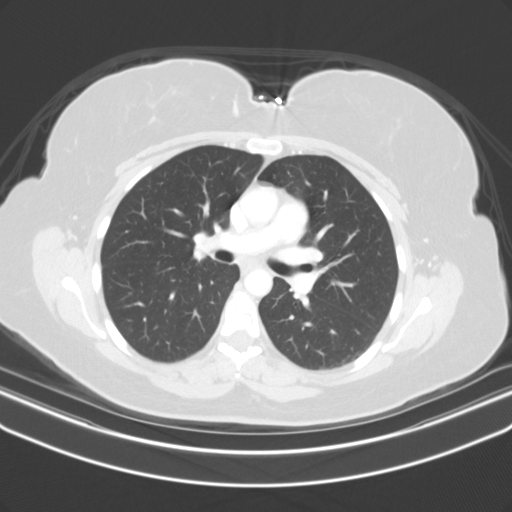
[im 177/201  soft-tissue]
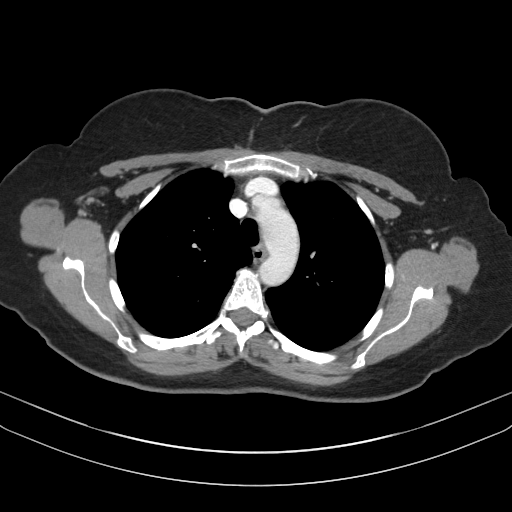
[im 177/201  lung]
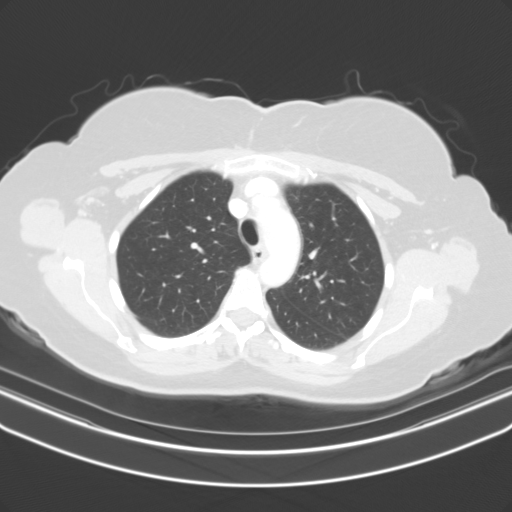
[im 189/201  soft-tissue]
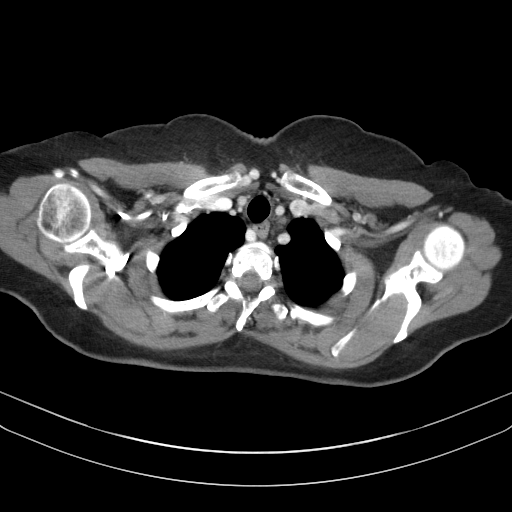
[im 189/201  lung]
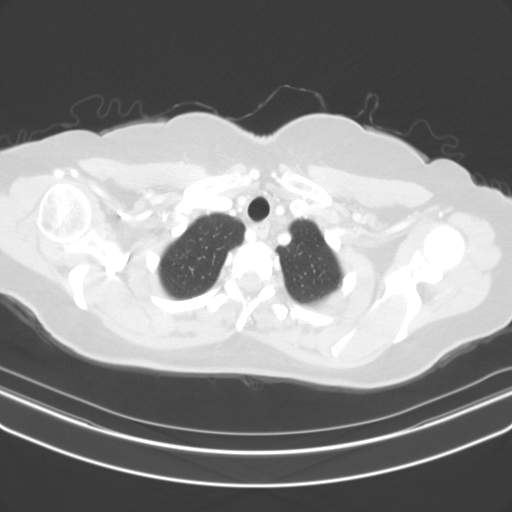

[13 of 32 positions shown; findings below may reference images not displayed]

FINDINGS: CT CHEST FINDINGS

Cardiovascular: The heart size is normal. No pericardial effusion.
No thoracic aortic aneurysm. Aberrant origin right subclavian artery
noted, passing posterior to the esophagus.

Mediastinum/Nodes: Bilateral thyroid nodules measure up to about 9
mm. No mediastinal lymphadenopathy. There is no hilar
lymphadenopathy. The esophagus has normal imaging features. There is
no axillary lymphadenopathy.

Lungs/Pleura: Lungs are clear without nodule or mass. No focal
airspace consolidation. Trace subsegmental atelectasis noted in the
lingula and left lower lobe

Musculoskeletal: Bone windows reveal no worrisome lytic or sclerotic
osseous lesions.

CT ABDOMEN PELVIS FINDINGS

Hepatobiliary: No focal abnormality within the liver parenchyma.
There is no evidence for gallstones, gallbladder wall thickening, or
pericholecystic fluid. No intrahepatic or extrahepatic biliary
dilation.

Pancreas: No focal mass lesion. No dilatation of the main duct. No
intraparenchymal cyst. No peripancreatic edema.

Spleen: No splenomegaly. No focal mass lesion.

Adrenals/Urinary Tract: No adrenal nodule or mass. Kidneys are
unremarkable. No evidence for hydroureter. The urinary bladder
appears normal for the degree of distention.

Stomach/Bowel: Stomach is nondistended. No gastric wall thickening.
No evidence of outlet obstruction. Duodenum is normally positioned
as is the ligament of Treitz. No small bowel wall thickening. No
small bowel dilatation. The terminal ileum is normal.
Nonvisualization of the appendix is consistent with the reported
history of appendectomy. Moderate stool volume along the length of
the colon.

Vascular/Lymphatic: No abdominal aortic aneurysm. There is no
gastrohepatic or hepatoduodenal ligament lymphadenopathy. No
intraperitoneal or retroperitoneal lymphadenopathy. No pelvic
sidewall lymphadenopathy.

Reproductive: The uterus has normal CT imaging appearance. There is
no adnexal mass.

Other: No intraperitoneal free fluid.

Musculoskeletal: Bone windows reveal no worrisome lytic or sclerotic
osseous lesions.
IMPRESSION: Bilateral thyroid nodules.

1. No acute findings in the chest, abdomen, or pelvis.
2. Aberrant origin of the right subclavian artery, passing posterior
to the esophagus. This variant anatomy
3. can result in dysphagia lusoria..
4. Bilateral thyroid nodules measuring up to 9 mm. Based on patient
age and nodule size these are most likely benign and consensus
guidelines suggests no further imaging follow-up warranted. This
follows ACR consensus guidelines: Managing Incidental Thyroid
Nodules Detected on Imaging: White Paper of [REDACTED]. [HOSPITAL] 1600; [DATE].

## 2019-11-16 DIAGNOSIS — Z0000 Encounter for general adult medical examination without abnormal findings: Secondary | ICD-10-CM | POA: Diagnosis not present

## 2019-11-16 DIAGNOSIS — Z6825 Body mass index (BMI) 25.0-25.9, adult: Secondary | ICD-10-CM | POA: Diagnosis not present

## 2019-11-16 DIAGNOSIS — Z23 Encounter for immunization: Secondary | ICD-10-CM | POA: Diagnosis not present

## 2019-11-16 DIAGNOSIS — Z1331 Encounter for screening for depression: Secondary | ICD-10-CM | POA: Diagnosis not present

## 2020-01-11 DIAGNOSIS — Z1151 Encounter for screening for human papillomavirus (HPV): Secondary | ICD-10-CM | POA: Diagnosis not present

## 2020-01-11 DIAGNOSIS — R319 Hematuria, unspecified: Secondary | ICD-10-CM | POA: Diagnosis not present

## 2020-01-11 DIAGNOSIS — Z01419 Encounter for gynecological examination (general) (routine) without abnormal findings: Secondary | ICD-10-CM | POA: Diagnosis not present

## 2020-01-11 DIAGNOSIS — Z1231 Encounter for screening mammogram for malignant neoplasm of breast: Secondary | ICD-10-CM | POA: Diagnosis not present

## 2020-01-11 DIAGNOSIS — Z6825 Body mass index (BMI) 25.0-25.9, adult: Secondary | ICD-10-CM | POA: Diagnosis not present

## 2020-01-11 DIAGNOSIS — Z124 Encounter for screening for malignant neoplasm of cervix: Secondary | ICD-10-CM | POA: Diagnosis not present

## 2020-01-20 ENCOUNTER — Other Ambulatory Visit: Payer: Self-pay | Admitting: Obstetrics and Gynecology

## 2020-01-20 DIAGNOSIS — N6489 Other specified disorders of breast: Secondary | ICD-10-CM

## 2020-02-10 ENCOUNTER — Other Ambulatory Visit: Payer: BLUE CROSS/BLUE SHIELD

## 2020-02-17 ENCOUNTER — Other Ambulatory Visit: Payer: Self-pay

## 2020-02-17 ENCOUNTER — Ambulatory Visit
Admission: RE | Admit: 2020-02-17 | Discharge: 2020-02-17 | Disposition: A | Discharge status: Home / Self Care | Payer: BLUE CROSS/BLUE SHIELD | Source: Ambulatory Visit | Attending: Obstetrics and Gynecology | Admitting: Obstetrics and Gynecology

## 2020-02-17 ENCOUNTER — Other Ambulatory Visit: Payer: Self-pay | Admitting: Obstetrics and Gynecology

## 2020-02-17 DIAGNOSIS — N6489 Other specified disorders of breast: Secondary | ICD-10-CM

## 2020-02-17 DIAGNOSIS — R928 Other abnormal and inconclusive findings on diagnostic imaging of breast: Secondary | ICD-10-CM | POA: Diagnosis not present

## 2020-02-22 DIAGNOSIS — Z20822 Contact with and (suspected) exposure to covid-19: Secondary | ICD-10-CM | POA: Diagnosis not present

## 2020-02-22 DIAGNOSIS — J019 Acute sinusitis, unspecified: Secondary | ICD-10-CM | POA: Diagnosis not present

## 2020-03-14 DIAGNOSIS — R87615 Unsatisfactory cytologic smear of cervix: Secondary | ICD-10-CM | POA: Diagnosis not present

## 2021-05-05 DIAGNOSIS — J0100 Acute maxillary sinusitis, unspecified: Secondary | ICD-10-CM | POA: Diagnosis not present

## 2021-05-05 DIAGNOSIS — R0981 Nasal congestion: Secondary | ICD-10-CM | POA: Diagnosis not present

## 2021-05-05 DIAGNOSIS — R0982 Postnasal drip: Secondary | ICD-10-CM | POA: Diagnosis not present

## 2021-05-10 DIAGNOSIS — Z01419 Encounter for gynecological examination (general) (routine) without abnormal findings: Secondary | ICD-10-CM | POA: Diagnosis not present

## 2021-05-10 DIAGNOSIS — N952 Postmenopausal atrophic vaginitis: Secondary | ICD-10-CM | POA: Diagnosis not present

## 2021-05-10 DIAGNOSIS — Z1389 Encounter for screening for other disorder: Secondary | ICD-10-CM | POA: Diagnosis not present

## 2021-05-10 DIAGNOSIS — Z1231 Encounter for screening mammogram for malignant neoplasm of breast: Secondary | ICD-10-CM | POA: Diagnosis not present

## 2021-05-12 DIAGNOSIS — H1820 Unspecified corneal edema: Secondary | ICD-10-CM | POA: Diagnosis not present

## 2021-06-06 DIAGNOSIS — H16051 Mooren's corneal ulcer, right eye: Secondary | ICD-10-CM | POA: Diagnosis not present

## 2021-07-05 DIAGNOSIS — H5711 Ocular pain, right eye: Secondary | ICD-10-CM | POA: Diagnosis not present

## 2021-07-05 DIAGNOSIS — Z6825 Body mass index (BMI) 25.0-25.9, adult: Secondary | ICD-10-CM | POA: Diagnosis not present

## 2021-07-05 DIAGNOSIS — R519 Headache, unspecified: Secondary | ICD-10-CM | POA: Diagnosis not present

## 2021-07-07 ENCOUNTER — Other Ambulatory Visit: Payer: Self-pay | Admitting: Family Medicine

## 2021-07-07 DIAGNOSIS — R519 Headache, unspecified: Secondary | ICD-10-CM

## 2021-07-19 ENCOUNTER — Ambulatory Visit
Admission: RE | Admit: 2021-07-19 | Discharge: 2021-07-19 | Disposition: A | Discharge status: Home / Self Care | Payer: BLUE CROSS/BLUE SHIELD | Source: Ambulatory Visit | Attending: Family Medicine | Admitting: Family Medicine

## 2021-07-19 DIAGNOSIS — R519 Headache, unspecified: Secondary | ICD-10-CM

## 2021-07-19 MED ORDER — GADOBENATE DIMEGLUMINE 529 MG/ML IV SOLN
12.0000 mL | Freq: Once | INTRAVENOUS | Status: AC | PRN
  Administered 2021-07-19: 12 mL via INTRAVENOUS

## 2021-09-19 ENCOUNTER — Encounter: Payer: Self-pay | Admitting: *Deleted

## 2021-09-21 ENCOUNTER — Ambulatory Visit: Payer: BLUE CROSS/BLUE SHIELD | Admitting: Psychiatry

## 2021-09-21 ENCOUNTER — Encounter: Payer: Self-pay | Admitting: Psychiatry

## 2021-09-21 VITALS — BP 119/75 | HR 73 | Ht 62.0 in | Wt 136.4 lb

## 2021-09-21 DIAGNOSIS — Z79899 Other long term (current) drug therapy: Secondary | ICD-10-CM | POA: Diagnosis not present

## 2021-09-21 DIAGNOSIS — M25551 Pain in right hip: Secondary | ICD-10-CM

## 2021-09-21 DIAGNOSIS — M25552 Pain in left hip: Secondary | ICD-10-CM | POA: Diagnosis not present

## 2021-09-21 DIAGNOSIS — G43009 Migraine without aura, not intractable, without status migrainosus: Secondary | ICD-10-CM | POA: Diagnosis not present

## 2021-09-21 DIAGNOSIS — H532 Diplopia: Secondary | ICD-10-CM | POA: Diagnosis not present

## 2021-09-21 MED ORDER — RIZATRIPTAN BENZOATE 10 MG PO TABS: 10.0000 mg | ORAL_TABLET | ORAL | 6 refills | Status: DC | PRN

## 2021-09-21 NOTE — Progress Notes (Signed)
Referring:  Ailene Ravel, MD [address] [place],  [place] [address]  PCP: Pcp, No  Neurology was asked to evaluate Heidi Hart, a 56 year old female for a chief complaint of headaches.  Our recommendations of care will be communicated by shared medical record.    CC:  headaches  History provided from self  HPI:  Medical co-morbidities: esophagitis  The patient presents for evaluation of headaches which began in January 2023. 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 She developed flu-like symptoms and then felt a severe pain in her right eye. She went to the ED where she was given steroids. This did help temporarily but did not resolve the pain. Went to the eye doctor who told her the eye was inflamed and gave her steroid eye drops. She was told this might be associated with an autoimmune disorder. Notes she does suffer from joint pain which is worst in her hips.  Since January she has also been having headaches. Headaches are worse at bedtime. She would have throbbing pain behind her eye or at her occiput. They were associated with phonophobia and nausea. Headaches can last up to 24 hours at a time. Initially they were daily, but they have been improving over time. Hasn't had a headache in the past 2 weeks. She does note that she is prone to headaches, but has never been diagnosed with migraines.  Had one episode of double vision which lasted for a couple of minutes. Thinks it did improve when she closed one of her eyes. Has a follow up appointment with the eye doctor in August.  Brain MRI with contrast was done in April 2023 and was unremarkable.  Headache History: Onset: January 2023 Aura: no Location: R>L Quality/Description: pounding Associated Symptoms:  Photophobia: no  Phonophobia: yes  Nausea: yes Worse with activity?: yes Duration of headaches: 24 hours  Headache days per month: 15 Headache free days per month:  15  Current Treatment: Abortive Advil  Preventative none  Prior Therapies                                 Advil Tylenol   LABS: CBC    Component Value Date/Time   WBC 4.9 11/14/2016 0916   RBC 4.01 11/14/2016 0916   HGB 11.8 11/14/2016 0916   HCT 34.9 11/14/2016 0916   PLT 202 11/14/2016 0916   MCV 87 11/14/2016 0916   MCH 29.4 11/14/2016 0916   MCHC 33.8 11/14/2016 0916   RDW 12.8 11/14/2016 0916   LYMPHSABS 1.4 11/14/2016 0916   EOSABS 0.2 11/14/2016 0916   BASOSABS 0.0 11/14/2016 0916      Latest Ref Rng & Units 11/14/2016    9:16 AM  CMP  Glucose 65 - 99 mg/dL 87   BUN 6 - 24 mg/dL 21   Creatinine 0.17 - 1.00 mg/dL 7.93   Sodium 903 - 009 mmol/L 142   Potassium 3.5 - 5.2 mmol/L 4.3   Chloride 96 - 106 mmol/L 104   CO2 20 - 29 mmol/L 25   Calcium 8.7 - 10.2 mg/dL 9.4   Total Protein 6.0 - 8.5 g/dL 6.7   Total Bilirubin 0.0 - 1.2 mg/dL 0.4   Alkaline Phos 39 - 117 IU/L 70   AST 0 - 40 IU/L 20   ALT 0 - 32 IU/L 19  IMAGING:  MRI brain with contrast 07/19/21: unremarkable  Imaging independently reviewed on September 21, 2021   Current Outpatient Medications on File Prior to Visit  Medication Sig Dispense Refill   cetirizine (ZYRTEC) 10 MG tablet Take 10 mg by mouth daily.     omeprazole (PRILOSEC) 40 MG capsule Take 1 capsule (40 mg total) by mouth daily. 90 capsule 3   Current Facility-Administered Medications on File Prior to Visit  Medication Dose Route Frequency Provider Last Rate Last Admin   0.9 %  sodium chloride infusion  500 mL Intravenous Once Rachael FeeJacobs, Daniel P, MD         Allergies: Allergies  Allergen Reactions   Cogentin [Benztropine]     hallucations   Reglan [Metoclopramide]     Muscle spasms   Sulfa Antibiotics Itching    tingling    Family History: Migraine or other headaches in the family:  no Aneurysms in a first degree relative:  no Brain tumors in the family:  no Other neurological illness in the family:   no  Past  Medical History: Past Medical History:  Diagnosis Date   Allergy    Anemia    Gastritis    Headache    IBS (irritable bowel syndrome)     Past Surgical History Past Surgical History:  Procedure Laterality Date   APPENDECTOMY  1991   LAPAROSCOPY  1992   LAPAROSCOPY  1995   OOPHORECTOMY  2004   left   OVARIAN CYST REMOVAL  2007   PILONIDAL CYST EXCISION  1992    Social History: Social History   Tobacco Use   Smoking status: Never   Smokeless tobacco: Never  Vaping Use   Vaping Use: Never used  Substance Use Topics   Alcohol use: No   Drug use: No     ROS: Negative for fevers, chills. Positive for headaches, double vision. All other systems reviewed and negative unless stated otherwise in HPI.   Physical Exam:   Vital Signs: BP 119/75   Pulse 73   Ht 5\' 2"  (1.575 m)   Wt 136 lb 6.4 oz (61.9 kg)   LMP 10/24/2016   BMI 24.95 kg/m  GENERAL: well appearing,in no acute distress,alert SKIN:  Color, texture, turgor normal. No rashes or lesions HEAD:  Normocephalic/atraumatic. CV:  RRR RESP: Normal respiratory effort MSK: no tenderness to palpation over occiput, neck, or shoulders  NEUROLOGICAL: Mental Status: Alert, oriented to person, place and time,Follows commands Cranial Nerves: PERRL, visual fields intact to confrontation, extraocular movements intact, facial sensation intact, no facial droop or ptosis, hearing grossly intact, no dysarthria Motor: muscle strength 5/5 both upper and lower extremities Reflexes: 2+ throughout Sensation: intact to light touch all 4 extremities Coordination: Finger-to- nose-finger intact bilaterally Gait: normal-based   IMPRESSION: 56 year old female with a history of esophagitis who presents for evaluation of headaches and double vision. Brain MRI is normal, reviewed images with the patient today. Neurological exam today is normal. She reports that she was recently diagnosed with inflammation of the right eye, which may be  contributing to her symptoms. These notes are unfortunately not available today. She does have a follow up eye exam scheduled in August. Will check blood work for causes of double vision and check ANA given joint pain and eye inflammation. Her headaches sound consistent with migraines. They have improved recently and she has not had any in the past 2 weeks. Will start Maxalt as needed if headaches return.  PLAN: -Blood work: TSH,  A1c, ANA -Start Maxalt 10 mg as needed for migraine  I spent a total of 36 minutes chart reviewing and counseling the patient. Headache education was done. Discussed treatment options including acute medications. Discussed medication side effects, adverse reactions and drug interactions. Written educational materials and patient instructions outlining all of the above were given.  Follow-up: 6 months   Ocie Doyne, MD 09/21/2021   2:37 PM

## 2021-09-21 NOTE — Patient Instructions (Signed)
Start rizatriptan (Maxalt) as needed for migraines. Take at the onset of migraine. If headache recurs or does not fully resolve, you may take a second dose after 2 hours. Please avoid taking more than 2 days per week to avoid rebound headaches Blood work

## 2021-09-23 LAB — ANA COMPREHENSIVE PANEL

## 2021-09-23 LAB — HEMOGLOBIN A1C: Hgb A1c MFr Bld: 5.3 % (ref 4.8–5.6)

## 2021-09-26 ENCOUNTER — Telehealth: Payer: Self-pay

## 2021-09-26 DIAGNOSIS — H532 Diplopia: Secondary | ICD-10-CM

## 2021-09-26 DIAGNOSIS — G43009 Migraine without aura, not intractable, without status migrainosus: Secondary | ICD-10-CM

## 2021-09-26 LAB — TSH

## 2021-09-26 LAB — ANA COMPREHENSIVE PANEL

## 2021-09-26 LAB — HEMOGLOBIN A1C: Est. average glucose Bld gHb Est-mCnc: 105 mg/dL

## 2021-09-26 NOTE — Telephone Encounter (Signed)
Holly from the lab spoke with me and advised that two labs from yesterday were unable to be processed and pt would need to have a redraw.   Two labs still need are TSH and the ANA comprehensive panel.   I have sent a mychart message to the pt updating the pt on this.

## 2021-09-28 NOTE — Addendum Note (Signed)
Addended by: Ann Maki on: 09/28/2021 03:40 PM   Modules accepted: Orders

## 2021-09-29 DIAGNOSIS — H532 Diplopia: Secondary | ICD-10-CM | POA: Diagnosis not present

## 2021-09-29 DIAGNOSIS — G43009 Migraine without aura, not intractable, without status migrainosus: Secondary | ICD-10-CM | POA: Diagnosis not present

## 2021-09-30 LAB — ANA COMPREHENSIVE PANEL
Anti JO-1: <0.2 AI (ref 0.0–0.9)
Centromere Ab Screen: <0.2 AI (ref 0.0–0.9)
Chromatin Ab SerPl-aCnc: <0.2 AI (ref 0.0–0.9)
ENA RNP Ab: <0.2 AI (ref 0.0–0.9)
ENA SM Ab Ser-aCnc: <0.2 AI (ref 0.0–0.9)
ENA SSA (RO) Ab: <0.2 AI (ref 0.0–0.9)
ENA SSB (LA) Ab: <0.2 AI (ref 0.0–0.9)
Scleroderma (Scl-70) (ENA) Antibody, IgG: <0.2 AI (ref 0.0–0.9)
dsDNA Ab: <1 IU/mL (ref 0–9)

## 2021-09-30 LAB — TSH: TSH: 2.03 u[IU]/mL (ref 0.450–4.500)

## 2022-01-06 DIAGNOSIS — J069 Acute upper respiratory infection, unspecified: Secondary | ICD-10-CM | POA: Diagnosis not present

## 2022-01-06 DIAGNOSIS — Z20822 Contact with and (suspected) exposure to covid-19: Secondary | ICD-10-CM | POA: Diagnosis not present

## 2022-03-28 NOTE — Progress Notes (Unsigned)
   CC:  headaches  Follow-up Visit  Last visit: 09/21/21  Brief HPI: 56 year old female with a history of esophagitis who follows in clinic for migraines and double vision. Brain MRI in April 2023 was unremarkable.  At her last visit, Maxalt was started for migraine rescue.  Interval History: Headaches***  Double vision***eye doctor***   Headache days per month: *** Migraine days per month*** Headache free days per month: ***  Current Headache Regimen: Preventative: *** Abortive: ***   Prior Therapies                                  Advil Tylenol  Physical Exam:   Vital Signs: LMP 10/24/2016  GENERAL:  well appearing, in no acute distress, alert  SKIN:  Color, texture, turgor normal. No rashes or lesions HEAD:  Normocephalic/atraumatic. RESP: normal respiratory effort MSK:  No gross joint deformities.   NEUROLOGICAL: Mental Status: Alert, oriented to person, place and time, Follows commands, and Speech fluent and appropriate. Cranial Nerves: PERRL, face symmetric, no dysarthria, hearing grossly intact Motor: moves all extremities equally Gait: normal-based.  IMPRESSION: ***  PLAN: ***   Follow-up: ***  I spent a total of *** minutes on the date of the service. Headache education was done. Discussed lifestyle modification including increased oral hydration, decreased caffeine, exercise and stress management. Discussed treatment options including preventive and acute medications, natural supplements, and infusion therapy. Discussed medication overuse headache and to limit use of acute treatments to no more than 2 days/week or 10 days/month. Discussed medication side effects, adverse reactions and drug interactions. Written educational materials and patient instructions outlining all of the above were given.  Ocie Doyne, MD

## 2022-03-29 ENCOUNTER — Encounter: Payer: Self-pay | Admitting: Psychiatry

## 2022-03-29 ENCOUNTER — Ambulatory Visit: Payer: 59 | Admitting: Psychiatry

## 2022-03-29 VITALS — BP 133/82 | HR 64 | Ht 62.0 in | Wt 136.4 lb

## 2022-03-29 DIAGNOSIS — G43009 Migraine without aura, not intractable, without status migrainosus: Secondary | ICD-10-CM | POA: Diagnosis not present

## 2022-03-29 MED ORDER — RIZATRIPTAN BENZOATE 10 MG PO TABS: 10.0000 mg | ORAL_TABLET | ORAL | 11 refills | Status: AC | PRN

## 2022-11-26 ENCOUNTER — Telehealth: Payer: Self-pay | Admitting: Psychiatry

## 2022-11-26 ENCOUNTER — Encounter: Payer: Self-pay | Admitting: Psychiatry

## 2022-11-26 NOTE — Telephone Encounter (Signed)
LVM and sent letter in mail informing pt of need to reschedule 03/21/23 appt - MD leaving practice

## 2023-03-21 ENCOUNTER — Ambulatory Visit: Payer: 59 | Admitting: Neurology

## 2023-03-21 ENCOUNTER — Encounter: Payer: Self-pay | Admitting: Neurology

## 2023-03-21 ENCOUNTER — Ambulatory Visit: Payer: 59 | Admitting: Psychiatry

## 2023-03-21 VITALS — BP 122/89 | HR 89 | Ht 62.0 in | Wt 139.0 lb

## 2023-03-21 DIAGNOSIS — G43709 Chronic migraine without aura, not intractable, without status migrainosus: Secondary | ICD-10-CM

## 2023-03-21 MED ORDER — SUMATRIPTAN SUCCINATE 50 MG PO TABS: 50.0000 mg | ORAL_TABLET | ORAL | 11 refills | Status: AC | PRN

## 2023-03-21 NOTE — Progress Notes (Signed)
Chief Complaint  Patient presents with   Migraine    Rm 15 alone Pt is well, reports she gets migraines based on the weather and stress. She has had about 3 a month recently.  She also mentions having pain and pressure on R side of face with vision disturbance.       ASSESSMENT AND PLAN  Heidi Hart is a 57 y.o. female   Chronic migraine headache,  Respond well to Maxalt but make her sleepy, will try Imitrex as needed,  Only return to clinic for new issues  DIAGNOSTIC DATA (LABS, IMAGING, TESTING) - I reviewed patient records, labs, notes, testing and imaging myself where available.   MEDICAL HISTORY:  Heidi Hart, is a 57 year old female  follow-up for migraine headaches, primary care physician is Dr. Nathanial Rancher, [facility], she was seen by Dr.Chima  History is obtained from the patient and review of electronic medical records. I personally reviewed pertinent available imaging films in PACS.   PMHx of   She had a history of migraine over the past few years, suboptimal response to over-the-counter medications, typical migraine are lateralized oftentimes right side pounding severe headache with light, noise sensitivity, lasting for few hours,  Trigger for her migraines are weather change, stress, sleep deprivation MRI brain w/wo was normal in April 2023,  Lab in June 2023, normal TSH, ANA, A1C,  She was given the prescription of Maxalt, works well, but make her sleepy, she is using them 1-3 times a month,   PHYSICAL EXAM:   Vitals:   03/21/23 1320  BP: 122/89  Pulse: 89  Weight: 139 lb (63 kg)  Height: 5\' 2"  (1.575 m)   Not recorded     Body mass index is 25.42 kg/m.  PHYSICAL EXAMNIATION:  Gen: NAD, conversant, well nourised, well groomed                     Cardiovascular: Regular rate rhythm, no peripheral edema, warm, nontender. Eyes: Conjunctivae clear without exudates or hemorrhage Neck: Supple, no carotid bruits. Pulmonary: Clear to auscultation  bilaterally   NEUROLOGICAL EXAM:  MENTAL STATUS: Speech/cognition: Awake, alert, oriented to history taking and casual conversation CRANIAL NERVES: CN II: Visual fields are full to confrontation. Pupils are round equal and briskly reactive to light. CN III, IV, VI: extraocular movement are normal. No ptosis. CN V: Facial sensation is intact to light touch CN VII: Face is symmetric with normal eye closure  CN VIII: Hearing is normal to causal conversation. CN IX, X: Phonation is normal. CN XI: Head turning and shoulder shrug are intact  MOTOR: There is no pronator drift of out-stretched arms. Muscle bulk and tone are normal. Muscle strength is normal.  REFLEXES: Reflexes are 2+ and symmetric at the biceps, triceps, knees, and ankles. Plantar responses are flexor.  SENSORY: Intact to light touch, pinprick and vibratory sensation are intact in fingers and toes.  COORDINATION: There is no trunk or limb dysmetria noted.  GAIT/STANCE: Posture is normal. Gait is steady   REVIEW OF SYSTEMS:  Full 14 system review of systems performed and notable only for as above All other review of systems were negative.   ALLERGIES: Allergies  Allergen Reactions   Cogentin [Benztropine]     hallucations   Reglan [Metoclopramide]     Muscle spasms   Sulfa Antibiotics Itching    tingling    HOME MEDICATIONS: Current Outpatient Medications  Medication Sig Dispense Refill   cetirizine (ZYRTEC) 10 MG  tablet Take 10 mg by mouth daily.     rizatriptan (MAXALT) 10 MG tablet Take 1 tablet (10 mg total) by mouth as needed for migraine. May repeat in 2 hours if needed 10 tablet 11   No current facility-administered medications for this visit.    PAST MEDICAL HISTORY: Past Medical History:  Diagnosis Date   Allergy    Anemia    Gastritis    Headache    IBS (irritable bowel syndrome)     PAST SURGICAL HISTORY: Past Surgical History:  Procedure Laterality Date   APPENDECTOMY  1991    LAPAROSCOPY  1992   LAPAROSCOPY  1995   OOPHORECTOMY  2004   left   OVARIAN CYST REMOVAL  2007   PILONIDAL CYST EXCISION  1992    FAMILY HISTORY: Family History  Problem Relation Age of Onset   Diabetes Mother    Cancer Mother        bone   Hypertension Mother    Hyperthyroidism Mother    Cancer Father        nonhodgkins lymphoma   Atrial fibrillation Father    Celiac disease Sister    Fibromyalgia Sister    Colon cancer Paternal Grandmother    Colon cancer Paternal Grandfather    Stomach cancer Neg Hx    Esophageal cancer Neg Hx    Headache Neg Hx     SOCIAL HISTORY: Social History   Socioeconomic History   Marital status: Married    Spouse name: Not on file   Number of children: Not on file   Years of education: Not on file   Highest education level: Not on file  Occupational History   Not on file  Tobacco Use   Smoking status: Never   Smokeless tobacco: Never  Vaping Use   Vaping status: Never Used  Substance and Sexual Activity   Alcohol use: No   Drug use: No   Sexual activity: Yes    Birth control/protection: None  Other Topics Concern   Not on file  Social History Narrative   Lives with husband, 3 adopted children   Social Drivers of Corporate investment banker Strain: Not on file  Food Insecurity: Not on file  Transportation Needs: Not on file  Physical Activity: Not on file  Stress: Not on file  Social Connections: Not on file  Intimate Partner Violence: Not on file      Levert Feinstein, M.D. Ph.D.  [facility] [address], [address] [place], [place] [address] Ph: [phone] Fax: [phone]  CC:  No referring provider defined for this encounter.  Pcp, No

## 2023-10-23 ENCOUNTER — Other Ambulatory Visit (HOSPITAL_BASED_OUTPATIENT_CLINIC_OR_DEPARTMENT_OTHER): Payer: Self-pay | Admitting: Obstetrics and Gynecology

## 2023-10-23 DIAGNOSIS — Z780 Asymptomatic menopausal state: Secondary | ICD-10-CM

## 2024-04-26 ENCOUNTER — Other Ambulatory Visit: Payer: Self-pay | Admitting: Neurology

## 2024-04-27 NOTE — Telephone Encounter (Signed)
 Last seen on 03/21/23 No follow up scheduled

## 2024-09-29 ENCOUNTER — Other Ambulatory Visit (HOSPITAL_BASED_OUTPATIENT_CLINIC_OR_DEPARTMENT_OTHER): Payer: Self-pay
